# Patient Record
Sex: Male | Born: 1985 | Hispanic: Yes | Marital: Married | Smoking: Former smoker
Health system: Southern US, Community
[De-identification: ages and names within clinical notes are randomized; demographics above are authoritative.]

---

## 2020-07-11 ENCOUNTER — Emergency Department (HOSPITAL_COMMUNITY): Payer: HRSA Program

## 2020-07-11 ENCOUNTER — Other Ambulatory Visit: Payer: Self-pay

## 2020-07-11 ENCOUNTER — Emergency Department (HOSPITAL_COMMUNITY)
Admission: EM | Admit: 2020-07-11 | Discharge: 2020-07-12 | Disposition: A | Discharge status: Home / Self Care | Payer: HRSA Program | Attending: Emergency Medicine | Admitting: Emergency Medicine

## 2020-07-11 DIAGNOSIS — U071 COVID-19: Secondary | ICD-10-CM | POA: Insufficient documentation

## 2020-07-11 DIAGNOSIS — Z5321 Procedure and treatment not carried out due to patient leaving prior to being seen by health care provider: Secondary | ICD-10-CM | POA: Insufficient documentation

## 2020-07-11 DIAGNOSIS — R059 Cough, unspecified: Secondary | ICD-10-CM | POA: Diagnosis present

## 2020-07-11 LAB — SARS CORONAVIRUS 2 (TAT 6-24 HRS): SARS Coronavirus 2: POSITIVE — AB

## 2020-07-11 MED ORDER — ACETAMINOPHEN 325 MG PO TABS
650.0000 mg | ORAL_TABLET | Freq: Once | ORAL | Status: AC | PRN
  Administered 2020-07-11: 650 mg via ORAL
  Filled 2020-07-11: qty 2

## 2020-07-11 NOTE — ED Triage Notes (Signed)
Pt reports cough, sob, and fever since Thursday. resp e.u, pt has not had any covid vaccines. Pt febrile in triage.

## 2020-07-12 NOTE — ED Notes (Signed)
Called Pt several times for vitals no answer.

## 2020-07-15 ENCOUNTER — Telehealth (HOSPITAL_COMMUNITY): Payer: Self-pay

## 2020-07-28 ENCOUNTER — Inpatient Hospital Stay (HOSPITAL_COMMUNITY)
Admission: EM | Admit: 2020-07-28 | Discharge: 2020-07-30 | DRG: 871 | Disposition: A | Discharge status: Home / Self Care | Payer: Self-pay | Attending: Family Medicine | Admitting: Family Medicine

## 2020-07-28 ENCOUNTER — Other Ambulatory Visit: Payer: Self-pay

## 2020-07-28 ENCOUNTER — Encounter (HOSPITAL_COMMUNITY): Payer: Self-pay | Admitting: *Deleted

## 2020-07-28 ENCOUNTER — Emergency Department (HOSPITAL_COMMUNITY): Payer: Self-pay

## 2020-07-28 DIAGNOSIS — E872 Acidosis: Secondary | ICD-10-CM | POA: Diagnosis present

## 2020-07-28 DIAGNOSIS — E861 Hypovolemia: Secondary | ICD-10-CM | POA: Diagnosis present

## 2020-07-28 DIAGNOSIS — E876 Hypokalemia: Secondary | ICD-10-CM | POA: Diagnosis present

## 2020-07-28 DIAGNOSIS — J9601 Acute respiratory failure with hypoxia: Secondary | ICD-10-CM | POA: Diagnosis present

## 2020-07-28 DIAGNOSIS — Z8249 Family history of ischemic heart disease and other diseases of the circulatory system: Secondary | ICD-10-CM

## 2020-07-28 DIAGNOSIS — D6959 Other secondary thrombocytopenia: Secondary | ICD-10-CM | POA: Diagnosis present

## 2020-07-28 DIAGNOSIS — E871 Hypo-osmolality and hyponatremia: Secondary | ICD-10-CM | POA: Diagnosis present

## 2020-07-28 DIAGNOSIS — A4189 Other specified sepsis: Principal | ICD-10-CM | POA: Diagnosis present

## 2020-07-28 DIAGNOSIS — J1282 Pneumonia due to coronavirus disease 2019: Secondary | ICD-10-CM | POA: Diagnosis present

## 2020-07-28 DIAGNOSIS — R030 Elevated blood-pressure reading, without diagnosis of hypertension: Secondary | ICD-10-CM | POA: Diagnosis present

## 2020-07-28 DIAGNOSIS — U071 COVID-19: Secondary | ICD-10-CM | POA: Diagnosis present

## 2020-07-28 LAB — COMPREHENSIVE METABOLIC PANEL
ALT: 159 U/L — ABNORMAL HIGH (ref 0–44)
AST: 72 U/L — ABNORMAL HIGH (ref 15–41)
Albumin: 3.2 g/dL — ABNORMAL LOW (ref 3.5–5.0)
Alkaline Phosphatase: 53 U/L (ref 38–126)
Anion gap: 16 — ABNORMAL HIGH (ref 5–15)
BUN: 13 mg/dL (ref 6–20)
CO2: 21 mmol/L — ABNORMAL LOW (ref 22–32)
Calcium: 8.6 mg/dL — ABNORMAL LOW (ref 8.9–10.3)
Chloride: 94 mmol/L — ABNORMAL LOW (ref 98–111)
Creatinine, Ser: 1.01 mg/dL (ref 0.61–1.24)
GFR, Estimated: >60 mL/min (ref >60)
Glucose, Bld: 100 mg/dL — ABNORMAL HIGH (ref 70–99)
Potassium: 3.2 mmol/L — ABNORMAL LOW (ref 3.5–5.1)
Sodium: 131 mmol/L — ABNORMAL LOW (ref 135–145)
Total Bilirubin: 0.9 mg/dL (ref 0.3–1.2)
Total Protein: 8.2 g/dL — ABNORMAL HIGH (ref 6.5–8.1)

## 2020-07-28 LAB — CBC WITH DIFFERENTIAL/PLATELET
Abs Immature Granulocytes: 0.22 10*3/uL — ABNORMAL HIGH (ref 0.00–0.07)
Basophils Absolute: 0.1 10*3/uL (ref 0.0–0.1)
Basophils Relative: 1 %
Eosinophils Absolute: 1 10*3/uL — ABNORMAL HIGH (ref 0.0–0.5)
Eosinophils Relative: 8 %
HCT: 44.8 % (ref 39.0–52.0)
Hemoglobin: 15.5 g/dL (ref 13.0–17.0)
Immature Granulocytes: 2 %
Lymphocytes Relative: 13 %
Lymphs Abs: 1.6 10*3/uL (ref 0.7–4.0)
MCH: 30.3 pg (ref 26.0–34.0)
MCHC: 34.6 g/dL (ref 30.0–36.0)
MCV: 87.7 fL (ref 80.0–100.0)
Monocytes Absolute: 0.5 10*3/uL (ref 0.1–1.0)
Monocytes Relative: 4 %
Neutro Abs: 8.8 10*3/uL — ABNORMAL HIGH (ref 1.7–7.7)
Neutrophils Relative %: 72 %
Platelets: 142 10*3/uL — ABNORMAL LOW (ref 150–400)
RBC: 5.11 MIL/uL (ref 4.22–5.81)
RDW: 13.5 % (ref 11.5–15.5)
WBC: 12.2 10*3/uL — ABNORMAL HIGH (ref 4.0–10.5)
nRBC: 0 % (ref 0.0–0.2)

## 2020-07-28 LAB — LACTIC ACID, PLASMA
Lactic Acid, Venous: 2.8 mmol/L (ref 0.5–1.9)
Lactic Acid, Venous: 1.9 mmol/L (ref 0.5–1.9)
Lactic Acid, Venous: 1.7 mmol/L (ref 0.5–1.9)

## 2020-07-28 LAB — BRAIN NATRIURETIC PEPTIDE: B Natriuretic Peptide: 17.5 pg/mL (ref 0.0–100.0)

## 2020-07-28 LAB — FIBRINOGEN: Fibrinogen: 677 mg/dL — ABNORMAL HIGH (ref 210–475)

## 2020-07-28 LAB — D-DIMER, QUANTITATIVE: D-Dimer, Quant: 5.52 ug/mL-FEU — ABNORMAL HIGH (ref 0.00–0.50)

## 2020-07-28 LAB — LACTATE DEHYDROGENASE: LDH: 485 U/L — ABNORMAL HIGH (ref 98–192)

## 2020-07-28 LAB — TROPONIN I (HIGH SENSITIVITY)
Troponin I (High Sensitivity): 3 ng/L (ref <18)
Troponin I (High Sensitivity): 4 ng/L (ref <18)

## 2020-07-28 LAB — HIV ANTIBODY (ROUTINE TESTING W REFLEX): HIV Screen 4th Generation wRfx: NONREACTIVE

## 2020-07-28 LAB — TRIGLYCERIDES: Triglycerides: 213 mg/dL — ABNORMAL HIGH (ref <150)

## 2020-07-28 LAB — C-REACTIVE PROTEIN: CRP: 10.6 mg/dL — ABNORMAL HIGH (ref <1.0)

## 2020-07-28 LAB — MAGNESIUM: Magnesium: 1.9 mg/dL (ref 1.7–2.4)

## 2020-07-28 LAB — PROCALCITONIN: Procalcitonin: 1.53 ng/mL

## 2020-07-28 LAB — FERRITIN: Ferritin: 2893 ng/mL — ABNORMAL HIGH (ref 24–336)

## 2020-07-28 MED ORDER — ACETAMINOPHEN 500 MG PO TABS
1000.0000 mg | ORAL_TABLET | Freq: Once | ORAL | Status: AC
  Administered 2020-07-28: 1000 mg via ORAL
  Filled 2020-07-28: qty 2

## 2020-07-28 MED ORDER — LACTATED RINGERS IV SOLN: INTRAVENOUS | Status: DC

## 2020-07-28 MED ORDER — IPRATROPIUM-ALBUTEROL 20-100 MCG/ACT IN AERS
1.0000 | INHALATION_SPRAY | Freq: Four times a day (QID) | RESPIRATORY_TRACT | Status: DC
  Administered 2020-07-28 – 2020-07-30 (×6): 1 via RESPIRATORY_TRACT
  Filled 2020-07-28 (×3): qty 4

## 2020-07-28 MED ORDER — METHYLPREDNISOLONE SODIUM SUCC 40 MG IJ SOLR: 40.0000 mg | Freq: Two times a day (BID) | INTRAMUSCULAR | Status: DC

## 2020-07-28 MED ORDER — POLYETHYLENE GLYCOL 3350 17 G PO PACK
17.0000 g | PACK | Freq: Every day | ORAL | Status: DC
  Administered 2020-07-30: 17 g via ORAL
  Filled 2020-07-28 (×3): qty 1

## 2020-07-28 MED ORDER — GUAIFENESIN-DM 100-10 MG/5ML PO SYRP: 10.0000 mL | ORAL_SOLUTION | ORAL | Status: DC | PRN

## 2020-07-28 MED ORDER — METHYLPREDNISOLONE SODIUM SUCC 40 MG IJ SOLR
40.0000 mg | Freq: Four times a day (QID) | INTRAMUSCULAR | Status: DC
  Administered 2020-07-28 – 2020-07-30 (×8): 40 mg via INTRAVENOUS
  Filled 2020-07-28 (×8): qty 1

## 2020-07-28 MED ORDER — SODIUM CHLORIDE 0.9 % IV SOLN
2.0000 g | INTRAVENOUS | Status: DC
  Administered 2020-07-29: 2 g via INTRAVENOUS
  Filled 2020-07-28 (×3): qty 20

## 2020-07-28 MED ORDER — PANTOPRAZOLE SODIUM 40 MG PO TBEC
40.0000 mg | DELAYED_RELEASE_TABLET | Freq: Every day | ORAL | Status: DC
  Administered 2020-07-29 – 2020-07-30 (×2): 40 mg via ORAL
  Filled 2020-07-28 (×2): qty 1

## 2020-07-28 MED ORDER — LACTATED RINGERS IV BOLUS
1000.0000 mL | Freq: Once | INTRAVENOUS | Status: AC
  Administered 2020-07-28: 1000 mL via INTRAVENOUS

## 2020-07-28 MED ORDER — SODIUM CHLORIDE 0.9 % IV SOLN
1.0000 g | Freq: Once | INTRAVENOUS | Status: AC
  Administered 2020-07-28: 1 g via INTRAVENOUS
  Filled 2020-07-28: qty 10

## 2020-07-28 MED ORDER — SODIUM CHLORIDE 0.9 % IV BOLUS
1000.0000 mL | Freq: Once | INTRAVENOUS | Status: AC
  Administered 2020-07-28: 1000 mL via INTRAVENOUS

## 2020-07-28 MED ORDER — SODIUM CHLORIDE 0.9 % IV SOLN
500.0000 mg | Freq: Once | INTRAVENOUS | Status: AC
  Administered 2020-07-28: 500 mg via INTRAVENOUS
  Filled 2020-07-28: qty 500

## 2020-07-28 MED ORDER — IOHEXOL 350 MG/ML SOLN
75.0000 mL | Freq: Once | INTRAVENOUS | Status: AC | PRN
  Administered 2020-07-28: 75 mL via INTRAVENOUS

## 2020-07-28 MED ORDER — POTASSIUM CHLORIDE CRYS ER 20 MEQ PO TBCR
40.0000 meq | EXTENDED_RELEASE_TABLET | Freq: Once | ORAL | Status: AC
  Administered 2020-07-28: 40 meq via ORAL
  Filled 2020-07-28: qty 2

## 2020-07-28 MED ORDER — ENOXAPARIN SODIUM 100 MG/ML ~~LOC~~ SOLN
1.0000 mg/kg | Freq: Two times a day (BID) | SUBCUTANEOUS | Status: DC
  Filled 2020-07-28 (×3): qty 0.85

## 2020-07-28 MED ORDER — AZITHROMYCIN 250 MG PO TABS
500.0000 mg | ORAL_TABLET | ORAL | Status: DC
  Administered 2020-07-29 – 2020-07-30 (×2): 500 mg via ORAL
  Filled 2020-07-28 (×2): qty 2

## 2020-07-28 MED ORDER — HYDROCOD POLST-CPM POLST ER 10-8 MG/5ML PO SUER: 5.0000 mL | Freq: Two times a day (BID) | ORAL | Status: DC | PRN

## 2020-07-28 MED ORDER — TRAZODONE HCL 50 MG PO TABS
50.0000 mg | ORAL_TABLET | Freq: Every evening | ORAL | Status: DC | PRN
Start: 2020-07-28 — End: 2020-07-30

## 2020-07-28 MED ORDER — ALBUTEROL SULFATE HFA 108 (90 BASE) MCG/ACT IN AERS
2.0000 | INHALATION_SPRAY | RESPIRATORY_TRACT | Status: DC | PRN
  Administered 2020-07-29: 2 via RESPIRATORY_TRACT
  Filled 2020-07-28: qty 6.7

## 2020-07-28 MED ORDER — ZINC SULFATE 220 (50 ZN) MG PO CAPS
220.0000 mg | ORAL_CAPSULE | Freq: Every day | ORAL | Status: DC
  Administered 2020-07-28 – 2020-07-30 (×3): 220 mg via ORAL
  Filled 2020-07-28 (×3): qty 1

## 2020-07-28 MED ORDER — ASCORBIC ACID 500 MG PO TABS
500.0000 mg | ORAL_TABLET | Freq: Every day | ORAL | Status: DC
  Administered 2020-07-28 – 2020-07-30 (×3): 500 mg via ORAL
  Filled 2020-07-28 (×3): qty 1

## 2020-07-28 NOTE — ED Notes (Signed)
Date and time results received: 07/28/20 5:29 PM    Test: LA Critical Value: 2.8  Name of Provider Notified: Steinl  Orders Received? Or Actions Taken?:

## 2020-07-28 NOTE — ED Triage Notes (Signed)
Pt has had covid symptoms since Monday, tested (+) Wed. Shob, cough and tachypnea today.Sats 86 in triage HR 140

## 2020-07-28 NOTE — ED Provider Notes (Signed)
[facility]-EMERGENCY DEPT Provider Note   CSN: [id number] Arrival date & time: 07/28/20  1232     History Chief Complaint  Patient presents with  . Covid Positive  . Cough    James Vaughan is a 35 y.o. male.  Patient presents with cough, sob, body aches, poor appetite, in the past two weeks. Symptoms acute onset, moderate, constant, persistent, slowly/steadily worse without acute or abrupt worsening today. Denies chest pain. No leg pain or swelling. No hx dvt or pe. Prior covid test 07/11/2020 positive. Pt is not vaccinated. Denies sore throat or trouble swallowing. No headache. No neck pain or stiffness. No abd pain or nvd. No dysuria. No rash.   The history is provided by the patient.  Cough Associated symptoms: fever, myalgias and shortness of breath   Associated symptoms: no chest pain, no headaches, no rash and no sore throat        History reviewed. No pertinent past medical history.  There are no problems to display for this patient.   History reviewed. No pertinent surgical history.     No family history on file.  Social History   Tobacco Use  . Smoking status: Unknown If Ever Smoked    Home Medications Prior to Admission medications   Not on File    Allergies    Patient has no known allergies.  Review of Systems   Review of Systems  Constitutional: Positive for appetite change and fever.  HENT: Negative for sore throat.   Eyes: Negative for redness.  Respiratory: Positive for cough and shortness of breath.   Cardiovascular: Negative for chest pain.  Gastrointestinal: Negative for abdominal pain, diarrhea and vomiting.  Endocrine: Negative for polyuria.  Genitourinary: Negative for dysuria and flank pain.  Musculoskeletal: Positive for myalgias. Negative for neck pain and neck stiffness.  Skin: Negative for rash.  Neurological: Negative for headaches.  Hematological: Does not bruise/bleed easily.  Psychiatric/Behavioral:  Negative for confusion.    Physical Exam Updated Vital Signs BP 121/76 (BP Location: Left Arm)   Pulse (!) 135   Temp 99 F (37.2 C) (Oral)   Resp (!) 22   Ht 1.702 m (5\' 7" )   Wt 86.2 kg   SpO2 (!) 88%   BMI 29.76 kg/m   Physical Exam Vitals and nursing note reviewed.  Constitutional:      Appearance: Normal appearance. He is well-developed.  HENT:     Head: Atraumatic.     Nose: Nose normal.     Mouth/Throat:     Mouth: Mucous membranes are moist.     Pharynx: Oropharynx is clear.  Eyes:     General: No scleral icterus.    Conjunctiva/sclera: Conjunctivae normal.     Pupils: Pupils are equal, round, and reactive to light.  Neck:     Trachea: No tracheal deviation.     Comments: No stiffness or rigidity Cardiovascular:     Rate and Rhythm: Normal rate and regular rhythm.     Pulses: Normal pulses.     Heart sounds: Normal heart sounds. No murmur heard. No friction rub. No gallop.   Pulmonary:     Effort: No accessory muscle usage.     Comments: Upper resp congestion, occasional non prod cough. No wheezing. Overall breathing comfortably, pt appears sob w walking.  Abdominal:     General: Bowel sounds are normal. There is no distension.     Palpations: Abdomen is soft.     Tenderness: There is no abdominal  tenderness.  Genitourinary:    Comments: No cva tenderness. Musculoskeletal:        General: No swelling or tenderness.     Cervical back: Normal range of motion and neck supple. No rigidity.     Right lower leg: No edema.     Left lower leg: No edema.  Skin:    General: Skin is warm and dry.     Findings: No rash.  Neurological:     Mental Status: He is alert.     Comments: Alert, speech clear.   Psychiatric:        Mood and Affect: Mood normal.     ED Results / Procedures / Treatments   Labs (all labs ordered are listed, but only abnormal results are displayed) Results for orders placed or performed during the hospital encounter of 07/28/20  CBC  WITH DIFFERENTIAL  Result Value Ref Range   WBC 12.2 (H) 4.0 - 10.5 K/uL   RBC 5.11 4.22 - 5.81 MIL/uL   Hemoglobin 15.5 13.0 - 17.0 g/dL   HCT 77.8 24.2 - 35.3 %   MCV 87.7 80.0 - 100.0 fL   MCH 30.3 26.0 - 34.0 pg   MCHC 34.6 30.0 - 36.0 g/dL   RDW 61.4 43.1 - 54.0 %   Platelets 142 (L) 150 - 400 K/uL   nRBC 0.0 0.0 - 0.2 %   Neutrophils Relative % 72 %   Neutro Abs 8.8 (H) 1.7 - 7.7 K/uL   Lymphocytes Relative 13 %   Lymphs Abs 1.6 0.7 - 4.0 K/uL   Monocytes Relative 4 %   Monocytes Absolute 0.5 0.1 - 1.0 K/uL   Eosinophils Relative 8 %   Eosinophils Absolute 1.0 (H) 0.0 - 0.5 K/uL   Basophils Relative 1 %   Basophils Absolute 0.1 0.0 - 0.1 K/uL   Immature Granulocytes 2 %   Abs Immature Granulocytes 0.22 (H) 0.00 - 0.07 K/uL   Reactive, Benign Lymphocytes PRESENT   Comprehensive metabolic panel  Result Value Ref Range   Sodium 131 (L) 135 - 145 mmol/L   Potassium 3.2 (L) 3.5 - 5.1 mmol/L   Chloride 94 (L) 98 - 111 mmol/L   CO2 21 (L) 22 - 32 mmol/L   Glucose, Bld 100 (H) 70 - 99 mg/dL   BUN 13 6 - 20 mg/dL   Creatinine, Ser 0.86 0.61 - 1.24 mg/dL   Calcium 8.6 (L) 8.9 - 10.3 mg/dL   Total Protein 8.2 (H) 6.5 - 8.1 g/dL   Albumin 3.2 (L) 3.5 - 5.0 g/dL   AST 72 (H) 15 - 41 U/L   ALT 159 (H) 0 - 44 U/L   Alkaline Phosphatase 53 38 - 126 U/L   Total Bilirubin 0.9 0.3 - 1.2 mg/dL   GFR, Estimated >76 >19 mL/min   Anion gap 16 (H) 5 - 15  D-dimer, quantitative  Result Value Ref Range   D-Dimer, Quant 5.52 (H) 0.00 - 0.50 ug/mL-FEU  Procalcitonin  Result Value Ref Range   Procalcitonin 1.53 ng/mL  Lactate dehydrogenase  Result Value Ref Range   LDH 485 (H) 98 - 192 U/L  Triglycerides  Result Value Ref Range   Triglycerides 213 (H) <150 mg/dL  Fibrinogen  Result Value Ref Range   Fibrinogen 677 (H) 210 - 475 mg/dL   DG Chest 2 View  Result Date: 07/28/2020 CLINICAL DATA:  Shortness of breath, COVID symptoms EXAM: CHEST - 2 VIEW COMPARISON:  July 11, 2020  FINDINGS: The heart size  and mediastinal contours are within normal limits. Extensive multifocal patchy airspace opacities are seen throughout both lungs, most notable within the periphery of the left lung. No pleural effusion is seen. IMPRESSION: Interval progression of extensive multifocal airspace opacities, left greater than right, consistent with multifocal pneumonia Electronically Signed   By: Jonna Clark M.D.   On: 07/28/2020 13:25   DG Chest Portable 1 View  Result Date: 07/11/2020 CLINICAL DATA:  Shortness of breath and cough EXAM: PORTABLE CHEST 1 VIEW COMPARISON:  None. FINDINGS: Low lung volumes are present, causing crowding of the pulmonary vasculature. Bibasilar airspace opacities are present, left greater than right. Airway thickening is also noted. Heart size within normal limits. No blunting of the costophrenic angles. IMPRESSION: 1. Bibasilar airspace opacities, left greater than right, suspicious for pneumonia. 2. Airway thickening is also noted, suggesting bronchitis or reactive airways disease. Electronically Signed   By: Gaylyn Rong M.D.   On: 07/11/2020 17:26    EKG EKG Interpretation  Date/Time:  Thursday July 28 2020 15:48:58 EST Ventricular Rate:  139 PR Interval:    QRS Duration: 99 QT Interval:  322 QTC Calculation: 490 R Axis:   65 Text Interpretation: Narrow QRS tachycardia `a flutter w 2:1 block vs sinus tachycardia Prolonged QT interval No previous tracing Confirmed by Cathren Laine ([id number]) on 07/28/2020 4:22:39 PM   Radiology DG Chest 2 View  Result Date: 07/28/2020 CLINICAL DATA:  Shortness of breath, COVID symptoms EXAM: CHEST - 2 VIEW COMPARISON:  July 11, 2020 FINDINGS: The heart size and mediastinal contours are within normal limits. Extensive multifocal patchy airspace opacities are seen throughout both lungs, most notable within the periphery of the left lung. No pleural effusion is seen. IMPRESSION: Interval progression of extensive  multifocal airspace opacities, left greater than right, consistent with multifocal pneumonia Electronically Signed   By: Jonna Clark M.D.   On: 07/28/2020 13:25    Procedures Procedures   Medications Ordered in ED Medications  albuterol (VENTOLIN HFA) 108 (90 Base) MCG/ACT inhaler 2 puff (has no administration in time range)    ED Course  I have reviewed the triage vital signs and the nursing notes.  Pertinent labs & imaging results that were available during my care of the patient were reviewed by me and considered in my medical decision making (see chart for details).    MDM Rules/Calculators/A&P                         Iv ns. Continuous pulse ox and cardiac monitoring. Stat labs and imaging.   COVID test 07/11/2020 is positive.   James Vaughan was evaluated in Emergency Department on 07/28/2020 for the symptoms described in the history of present illness. He was evaluated in the context of the global COVID-19 pandemic, which necessitated consideration that the patient might be at risk for infection with the SARS-CoV-2 virus that causes COVID-19. Institutional protocols and algorithms that pertain to the evaluation of patients at risk for COVID-19 are in a state of rapid change based on information released by regulatory bodies including the CDC and federal and state organizations. These policies and algorithms were followed during the patient's care in the ED.  Reviewed nursing notes and prior charts for additional history.   Today's labs reviewed/interpreted by me - k mildly low. kcl po. Inflammatory markers elevated c/w covid.   CXR reviewed/interpreted by me - c/w covid, covid pna.   Pt denies any chest pain or discomfort.  Is  hypoxic on room air, sats is mid 90s on 3 liters [place].   Currently on monitor, appears sinus tachy, rate 118.    Hospitalists consulted for admission re covid pna/arf due to covid.   CRITICAL CARE RE: covid infection with acute respiratory failure w  hypoxia Performed by: Suzi Roots Total critical care time: 45 minutes Critical care time was exclusive of separately billable procedures and treating other patients. Critical care was necessary to treat or prevent imminent or life-threatening deterioration. Critical care was time spent personally by me on the following activities: development of treatment plan with patient and/or surrogate as well as nursing, discussions with consultants, evaluation of patient's response to treatment, examination of patient, obtaining history from patient or surrogate, ordering and performing treatments and interventions, ordering and review of laboratory studies, ordering and review of radiographic studies, pulse oximetry and re-evaluation of patient's condition.    Final Clinical Impression(s) / ED Diagnoses Final diagnoses:  None    Rx / DC Orders ED Discharge Orders    None       Cathren Laine, MD 07/28/20 2314

## 2020-07-28 NOTE — H&P (Addendum)
History and Physical    James Vaughan BZJ:[id number] DOB: January 06, 1986 DOA: 07/28/2020  PCP: Patient, No Pcp Per  Patient coming from: home  I have personally briefly reviewed patient's old medical records in [facility]  Chief Complaint: shortness of breath  HPI: James Vaughan is a 35 y.o. male without notable significant medical history, not vaccinated for sars cov2 infection presents with worsening shortness of breath over the past several days in setting of known covid 69 infection diagnosed on 07/11/2020.  Symptoms started prior to 1/10 with cough, SOB, and fever per triage note (left from ED without being seen), though he notes today he isn't really able to remember his symptoms prior to Monday.  Monday he notices worsening SOB.  He's was unable to walk up stairs or to bathroom without developing significant SOB requiring him to stop to catch his breath.  Denies recent fever.  Denies change in taste or smell.  Notes chest discomfort.  Denies abdominal pain, but has decreased appetite.  Denies LE swelling.  No smoking or drinking.  Denies any medical issues or taking any medicines.  ED Course: Labs, imaging, abx, hospitalist to admit for covid infection   Review of Systems: As per HPI otherwise all other systems reviewed and are negative.   History reviewed. No pertinent past medical history.  History reviewed. No pertinent surgical history.  Social History  has no history on file for tobacco use, alcohol use, and drug use. denies smoking, etoh  No Known Allergies  No family history on file. Mom with hx HTN  Prior to Admission medications   Not on File    Physical Exam: Vitals:   07/28/20 1645 07/28/20 1700 07/28/20 1703 07/28/20 1715  BP:    (!) 155/130  Pulse: (!) 121 (!) 123  (!) 116  Resp: (!) 36 (!) 40  (!) 35  Temp:      TempSrc:      SpO2: 91% 95% 93% 95%  Weight:      Height:        Constitutional: NAD, calm, comfortable Vitals:   07/28/20 1645  07/28/20 1700 07/28/20 1703 07/28/20 1715  BP:    (!) 155/130  Pulse: (!) 121 (!) 123  (!) 116  Resp: (!) 36 (!) 40  (!) 35  Temp:      TempSrc:      SpO2: 91% 95% 93% 95%  Weight:      Height:       Eyes: PERRL, lids and conjunctivae normal ENMT: Mucous membranes are moist. Posterior pharynx clear of any exudate or lesions.Normal dentition.  Neck: normal, supple Respiratory: increased wob, scattered wheezes and crackles, tachypneic after sitting up in bed for me to auscultate Cardiovascular: Regular rate and rhythm, no murmurs / rubs / gallops. No extremity edema. Abdomen: no tenderness, no masses palpated. No hepatosplenomegaly. Bowel sounds positive.  Musculoskeletal: no clubbing / cyanosis. No joint deformity upper and lower extremities. Good ROM, no contractures. Normal muscle tone.  Skin: no rashes, lesions, ulcers. No induration Neurologic: CN 2-12 grossly intact. Sensation intact. Strength 5/5 in all 4.  Psychiatric: Normal judgment and insight. Alert and oriented x 3. Normal mood.   Labs on Admission: I have personally reviewed following labs and imaging studies  CBC: Recent Labs  Lab 07/28/20 1448  WBC 12.2*  NEUTROABS 8.8*  HGB 15.5  HCT 44.8  MCV 87.7  PLT 142*    Basic Metabolic Panel: Recent Labs  Lab 07/28/20 1448  NA 131*  K 3.2*  CL 94*  CO2 21*  GLUCOSE 100*  BUN 13  CREATININE 1.01  CALCIUM 8.6*    GFR: Estimated Creatinine Clearance: 110 mL/min (by C-G formula based on SCr of 1.01 mg/dL).  Liver Function Tests: Recent Labs  Lab 07/28/20 1448  AST 72*  ALT 159*  ALKPHOS 53  BILITOT 0.9  PROT 8.2*  ALBUMIN 3.2*    Urine analysis: No results found for: COLORURINE, APPEARANCEUR, LABSPEC, PHURINE, GLUCOSEU, HGBUR, BILIRUBINUR, KETONESUR, PROTEINUR, UROBILINOGEN, NITRITE, LEUKOCYTESUR  Radiological Exams on Admission: DG Chest 2 View  Result Date: 07/28/2020 CLINICAL DATA:  Shortness of breath, COVID symptoms EXAM: CHEST - 2 VIEW  COMPARISON:  July 11, 2020 FINDINGS: The heart size and mediastinal contours are within normal limits. Extensive multifocal patchy airspace opacities are seen throughout both lungs, most notable within the periphery of the left lung. No pleural effusion is seen. IMPRESSION: Interval progression of extensive multifocal airspace opacities, left greater than right, consistent with multifocal pneumonia Electronically Signed   By: Jonna Clark M.D.   On: 07/28/2020 13:25    EKG: Independently reviewed. Sinus tach (vs?flutter - suspect sinus tach, will follow on tele and serial EKG), S1Q3T3 pattern, no prior EKG's  Assessment/Plan Active Problems:   COVID-19  Acute Hypoxic Respiratory Failure 2/2 COVID 19 Pneumonia  Concern for Superimposed Bacterial Pneumonia  Sepsis 2/2 covid Tested positive 07/11/2020 Satting in 90's on 3 L  sepsis ruled in with tachycardia, tachypnea/hypoxia, leukocytosis and in setting of covid CXR with interval progression of extensive multifocal airspace opacities L>R c/w multifocal pneumonia Procalcitonin is elevated -> ceftriaxone/azithro.  Follow sputum cx, MRSA pcr, urine strep, urine legionella. Follow RVP >10 days from presentation, low likelihood of benefit from remdesivir Steroids with taper.  With elevated procal and modest O2 requirement, hold off on actemra/baricitinib for now. Therapeutic lovenox with elevated d dimer, SOB seems out of proportion to hypoxia, S1Q3T3 on EKG Strict I/O, daily weights Prone as able, IS, flutter, OOB, therapy  COVID-19 Labs  Recent Labs    07/28/20 1448  DDIMER 5.52*  FERRITIN 2,893*  LDH 485*  CRP 10.6*    Lab Results  Component Value Date   SARSCOV2NAA POSITIVE (A) 07/11/2020   Elevated D dimer  S1Q3T3 Pattern on EKG  Concern for VTE: dyspnea seems out of proportion to oxygen requirements.  S1Q3T3 pattern on EKG suggests RH strain which could be due to pneumonia vs PE. Therapeutic lovenox LE [place], echo, CT PE  protocol Troponin, BNP  Sinus Tachycardia: 2/2 above (? Possible flutter, though suspect sinus tach) follow on tele and serial EKG's  Lactic Acidosis  Anion Gap Metabolic Acidosis: in setting of sepsis and covid pneumonia - will bolus conservatively and add gentle MIVF in setting of respiratory failure.  Trend  Hyponatremia  Hypokalemia: replace and follow, follow mag - hyponatremia likely hypovolemia with poor intake  Chest Discomfort: suspect pleuritic in setting of covid.  Will follow cardiac enzymes.  Elevated Blood Pressures: no formal diagnosis, monitor, possibly elevated with discomfort/SOB.  May need antihypertensives added if persistently elevated  Elevated LFT's: likely 2/2 covid, follow - acute hepatitis panel.  Normal bili.  Consider additional w/u as indicated.  Thrombocytopenia: mild, follow, 2/2 covid  DVT prophylaxis: Therapeutic lovenox  Code Status:   full  Family Communication:  None at bedside  Disposition Plan:   Patient is from:  home  Anticipated DC to:  home  Anticipated DC date:  >3 days  Anticipated DC barriers: Improvement in resp  distress  Consults called:  none Admission status:  inpatient   Severity of Illness: The appropriate patient status for this patient is INPATIENT. Inpatient status is judged to be reasonable and necessary in order to provide the required intensity of service to ensure the patient's safety. The patient's presenting symptoms, physical exam findings, and initial radiographic and laboratory data in the context of their chronic comorbidities is felt to place them at high risk for further clinical deterioration. Furthermore, it is not anticipated that the patient will be medically stable for discharge from the hospital within 2 midnights of admission. The following factors support the patient status of inpatient.   " The patient's presenting symptoms include shortness of breath, chest pain. " The worrisome physical exam findings  include hypoxia, increased WOB. " The initial radiographic and laboratory data are worrisome because of elevated d dimer, multifocal pneumonia.   * I certify that at the point of admission it is my clinical judgment that the patient will require inpatient hospital care spanning beyond 2 midnights from the point of admission due to high intensity of service, high risk for further deterioration and high frequency of surveillance required.Lacretia Nicks MD [facility]  How to contact the [facility] Attending or Consulting provider 7A - 7P or covering provider during after hours 7P -7A, for this patient?   1. Check the care team in [facility] and look for a) attending/consulting [facility] provider listed and b) the [facility] team listed 2. Log into [email or web address] and use [facility] universal password to access. If you do not have the password, please contact the hospital operator. 3. Locate the [facility] provider you are looking for under [facility] and page to a number that you can be directly reached. 4. If you still have difficulty reaching the provider, please page the [facility] (Director on Call) for the Hospitalists listed on amion for assistance.  07/28/2020, 5:37 PM

## 2020-07-29 ENCOUNTER — Encounter (HOSPITAL_COMMUNITY): Payer: Self-pay | Admitting: Family Medicine

## 2020-07-29 ENCOUNTER — Inpatient Hospital Stay (HOSPITAL_COMMUNITY): Payer: Self-pay

## 2020-07-29 DIAGNOSIS — A4189 Other specified sepsis: Principal | ICD-10-CM

## 2020-07-29 DIAGNOSIS — U071 COVID-19: Secondary | ICD-10-CM

## 2020-07-29 DIAGNOSIS — J9601 Acute respiratory failure with hypoxia: Secondary | ICD-10-CM

## 2020-07-29 DIAGNOSIS — R000 Tachycardia, unspecified: Secondary | ICD-10-CM | POA: Insufficient documentation

## 2020-07-29 DIAGNOSIS — D696 Thrombocytopenia, unspecified: Secondary | ICD-10-CM

## 2020-07-29 DIAGNOSIS — J1282 Pneumonia due to coronavirus disease 2019: Secondary | ICD-10-CM

## 2020-07-29 DIAGNOSIS — R7989 Other specified abnormal findings of blood chemistry: Secondary | ICD-10-CM

## 2020-07-29 LAB — CBC WITH DIFFERENTIAL/PLATELET
Abs Immature Granulocytes: 0.1 10*3/uL — ABNORMAL HIGH (ref 0.00–0.07)
Basophils Absolute: 0.1 10*3/uL (ref 0.0–0.1)
Basophils Relative: 1 %
Eosinophils Absolute: 0.2 10*3/uL (ref 0.0–0.5)
Eosinophils Relative: 2 %
HCT: 37.2 % — ABNORMAL LOW (ref 39.0–52.0)
Hemoglobin: 12.7 g/dL — ABNORMAL LOW (ref 13.0–17.0)
Immature Granulocytes: 2 %
Lymphocytes Relative: 21 %
Lymphs Abs: 1.4 10*3/uL (ref 0.7–4.0)
MCH: 30.1 pg (ref 26.0–34.0)
MCHC: 34.1 g/dL (ref 30.0–36.0)
MCV: 88.2 fL (ref 80.0–100.0)
Monocytes Absolute: 0.2 10*3/uL (ref 0.1–1.0)
Monocytes Relative: 2 %
Neutro Abs: 4.8 10*3/uL (ref 1.7–7.7)
Neutrophils Relative %: 72 %
Platelets: 120 10*3/uL — ABNORMAL LOW (ref 150–400)
RBC: 4.22 MIL/uL (ref 4.22–5.81)
RDW: 13.7 % (ref 11.5–15.5)
WBC: 6.6 10*3/uL (ref 4.0–10.5)
nRBC: 0 % (ref 0.0–0.2)

## 2020-07-29 LAB — COMPREHENSIVE METABOLIC PANEL
ALT: 127 U/L — ABNORMAL HIGH (ref 0–44)
AST: 55 U/L — ABNORMAL HIGH (ref 15–41)
Albumin: 2.6 g/dL — ABNORMAL LOW (ref 3.5–5.0)
Alkaline Phosphatase: 45 U/L (ref 38–126)
Anion gap: 10 (ref 5–15)
BUN: 16 mg/dL (ref 6–20)
CO2: 23 mmol/L (ref 22–32)
Calcium: 8.4 mg/dL — ABNORMAL LOW (ref 8.9–10.3)
Chloride: 103 mmol/L (ref 98–111)
Creatinine, Ser: 0.84 mg/dL (ref 0.61–1.24)
GFR, Estimated: >60 mL/min (ref >60)
Glucose, Bld: 150 mg/dL — ABNORMAL HIGH (ref 70–99)
Potassium: 4.5 mmol/L (ref 3.5–5.1)
Sodium: 136 mmol/L (ref 135–145)
Total Bilirubin: 0.5 mg/dL (ref 0.3–1.2)
Total Protein: 7.2 g/dL (ref 6.5–8.1)

## 2020-07-29 LAB — RESPIRATORY PANEL BY PCR

## 2020-07-29 LAB — ECHOCARDIOGRAM COMPLETE
Area-P 1/2: 4.71 cm2
Height: 67 in
S' Lateral: 2.6 cm
Weight: 3040 oz

## 2020-07-29 LAB — FERRITIN: Ferritin: 2281 ng/mL — ABNORMAL HIGH (ref 24–336)

## 2020-07-29 LAB — HEMOGLOBIN A1C
Hgb A1c MFr Bld: 5.5 % (ref 4.8–5.6)
Mean Plasma Glucose: 111.15 mg/dL

## 2020-07-29 LAB — HEPATITIS PANEL, ACUTE
HCV Ab: NONREACTIVE
Hep A IgM: NONREACTIVE
Hep B C IgM: NONREACTIVE
Hepatitis B Surface Ag: NONREACTIVE

## 2020-07-29 LAB — EXPECTORATED SPUTUM ASSESSMENT W GRAM STAIN, RFLX TO RESP C

## 2020-07-29 LAB — C-REACTIVE PROTEIN: CRP: 8.9 mg/dL — ABNORMAL HIGH (ref <1.0)

## 2020-07-29 LAB — PHOSPHORUS: Phosphorus: 4.3 mg/dL (ref 2.5–4.6)

## 2020-07-29 LAB — CBG MONITORING, ED
Glucose-Capillary: 162 mg/dL — ABNORMAL HIGH (ref 70–99)
Glucose-Capillary: 148 mg/dL — ABNORMAL HIGH (ref 70–99)

## 2020-07-29 LAB — STREP PNEUMONIAE URINARY ANTIGEN: Strep Pneumo Urinary Antigen: NEGATIVE

## 2020-07-29 LAB — MAGNESIUM: Magnesium: 2.6 mg/dL — ABNORMAL HIGH (ref 1.7–2.4)

## 2020-07-29 LAB — D-DIMER, QUANTITATIVE: D-Dimer, Quant: 3.67 ug/mL-FEU — ABNORMAL HIGH (ref 0.00–0.50)

## 2020-07-29 MED ORDER — ENOXAPARIN SODIUM 40 MG/0.4ML ~~LOC~~ SOLN
40.0000 mg | SUBCUTANEOUS | Status: DC
  Administered 2020-07-29: 40 mg via SUBCUTANEOUS
  Filled 2020-07-29: qty 0.4

## 2020-07-29 MED ORDER — INSULIN ASPART 100 UNIT/ML ~~LOC~~ SOLN
0.0000 [IU] | Freq: Three times a day (TID) | SUBCUTANEOUS | Status: DC
  Administered 2020-07-29: 2 [IU] via SUBCUTANEOUS
  Administered 2020-07-29: 1 [IU] via SUBCUTANEOUS
  Administered 2020-07-30: 3 [IU] via SUBCUTANEOUS
  Administered 2020-07-30: 1 [IU] via SUBCUTANEOUS
  Filled 2020-07-29: qty 0.09

## 2020-07-29 NOTE — ED Notes (Signed)
Called report to Brooke, RN

## 2020-07-29 NOTE — Progress Notes (Signed)
Bilateral lower extremity venous duplex has been completed. Preliminary results can be found in CV Proc through chart review.   07/29/20 11:35 AM Olen Cordial RVT

## 2020-07-29 NOTE — ED Notes (Signed)
Pt to take dinner tray upstairs with him.

## 2020-07-29 NOTE — Progress Notes (Signed)
*  PRELIMINARY RESULTS* Echocardiogram 2D Echocardiogram has been performed.  James Vaughan 07/29/2020, 2:40 PM

## 2020-07-29 NOTE — ED Notes (Signed)
Pt given meal tray.

## 2020-07-29 NOTE — Progress Notes (Signed)
PROGRESS NOTE    James Vaughan  CHE:[id number] DOB: January 03, 1986 DOA: 07/28/2020 PCP: Patient, No Pcp Per   Chief Complaint  Patient presents with  . Covid Positive  . Cough    Brief Narrative: James Vaughan is James Vaughan 35 y.o. male without notable significant medical history, not vaccinated for sars cov2 infection presents with worsening shortness of breath over the past several days.  Admitted for COVID 19 pneumonia.  Assessment & Plan:   Active Problems:   COVID-19  Acute Hypoxic Respiratory Failure 2/2 COVID 19 Pneumonia  Concern for Superimposed Bacterial Pneumonia  Sepsis 2/2 covid Tested positive 07/11/2020 Satting in 90's on 3 L - wean as tolerated sepsis ruled in with tachycardia, tachypnea/hypoxia, leukocytosis and in setting of covid CXR with interval progression of extensive multifocal airspace opacities L>R c/w multifocal pneumonia CT PE protocol with resp motion degradation limiting eval for PE (no central PE), multifocal pneumonia, mediastinal and hilar adenopathy Procalcitonin is elevated -> ceftriaxone/azithro.  Follow sputum cx, MRSA pcr, urine strep (negative), urine legionella (pending). Follow RVP >10 days from presentation, low likelihood of benefit from remdesivir Steroids with taper.  With elevated procal and modest O2 requirement, hold off on actemra/baricitinib for now. Therapeutic lovenox with elevated d dimer, SOB seems out of proportion to hypoxia, S1Q3T3 on EKG Strict I/O, daily weights Prone as able, IS, flutter, OOB, therapy  COVID-19 Labs  Recent Labs    07/28/20 1448 07/29/20 0522  DDIMER 5.52* 3.67*  FERRITIN 2,893* 2,281*  LDH 485*  --   CRP 10.6* 8.9*    Lab Results  Component Value Date   SARSCOV2NAA POSITIVE (Yehudit Fulginiti) 07/11/2020   Elevated D dimer  S1Q3T3 Pattern on EKG  Concern for VTE: dyspnea seems out of proportion to oxygen requirements.  S1Q3T3 pattern on EKG suggests RH strain which could be due to pneumonia vs PE. Therapeutic  lovenox LE [place], echo, CT PE motion degraded, no central PE Troponin, BNP (noth wnl)  Sinus Tachycardia: 2/2 above (? Possible flutter, though suspect sinus tach) follow on tele and serial EKG's Repeat EKG with sinus, IVCD, j point elevation, prolonged QTc  Prolonged QTc: avoid qt prolonging meds, follow  Lactic Acidosis  Anion Gap Metabolic Acidosis:resolved  Hyponatremia  Hypokalemia: replace and follow, follow mag - hyponatremia likely hypovolemia with poor intake  Chest Discomfort: negative troponins.  Likely pleuritic.  Low suspicion for ACS.  Elevated Blood Pressures: no formal diagnosis, monitor, possibly elevated with discomfort/SOB.  improved  Elevated LFT's: likely 2/2 covid, follow - acute hepatitis panel.  Normal bili.  Consider additional w/u as indicated. Improving  Thrombocytopenia: mild, follow, 2/2 covid  DVT prophylaxis: therapeutic lovenox Code Status: full  Family Communication: none at bedside Disposition:   Status is: Inpatient  Remains inpatient appropriate because:Inpatient level of care appropriate due to severity of illness   Dispo: The patient is from: Home              Anticipated d/c is to: Home              Anticipated d/c date is: 1 day              Patient currently is not medically stable to d/c.   Difficult to place patient No       Consultants:   none  Procedures:   none  Antimicrobials: Anti-infectives (From admission, onward)   Start     Dose/Rate Route Frequency Ordered Stop   07/29/20 1600  cefTRIAXone (ROCEPHIN) 2 g in sodium  chloride 0.9 % 100 mL IVPB        2 g 200 mL/hr over 30 Minutes Intravenous Every 24 hours 07/28/20 1729 08/03/20 1559   07/29/20 1600  azithromycin (ZITHROMAX) tablet 500 mg        500 mg Oral Every 24 hours 07/28/20 1729 08/02/20 1559   07/28/20 1700  cefTRIAXone (ROCEPHIN) 1 g in sodium chloride 0.9 % 100 mL IVPB        1 g 200 mL/hr over 30 Minutes Intravenous  Once 07/28/20 1650  07/28/20 1804   07/28/20 1700  azithromycin (ZITHROMAX) 500 mg in sodium chloride 0.9 % 250 mL IVPB        500 mg 250 mL/hr over 60 Minutes Intravenous  Once 07/28/20 1650 07/28/20 2053         Subjective: Feeling better today  Objective: Vitals:   07/29/20 0041 07/29/20 0045 07/29/20 0245 07/29/20 0545  BP: 137/82 137/76 128/77 115/78  Pulse: 81 84 75 82  Resp: (!) 28 (!) 27 (!) 30 (!) 30  Temp:      TempSrc:      SpO2: 92% 94% 94% 94%  Weight:      Height:        Intake/Output Summary (Last 24 hours) at 07/29/2020 9357 Last data filed at 07/28/2020 1804 Gross per 24 hour  Intake 1100 ml  Output --  Net 1100 ml   Filed Weights   07/28/20 1306  Weight: 86.2 kg    Examination:  General exam: Appears calm and comfortable  Respiratory system: bibasilar crackles, improved WOB from yesterday, less tachypneic Cardiovascular system: RRR Gastrointestinal system: Abdomen is nondistended, soft and nontender. Central nervous system: Alert and oriented. No focal neurological deficits. Extremities: no LEE Skin: No rashes, lesions or ulcers Psychiatry: Judgement and insight appear normal. Mood & affect appropriate.     Data Reviewed: I have personally reviewed following labs and imaging studies  CBC: Recent Labs  Lab 07/28/20 1448 07/29/20 0522  WBC 12.2* 6.6  NEUTROABS 8.8* 4.8  HGB 15.5 12.7*  HCT 44.8 37.2*  MCV 87.7 88.2  PLT 142* 120*    Basic Metabolic Panel: Recent Labs  Lab 07/28/20 1448 07/28/20 1800 07/29/20 0522  NA 131*  --  136  K 3.2*  --  4.5  CL 94*  --  103  CO2 21*  --  23  GLUCOSE 100*  --  150*  BUN 13  --  16  CREATININE 1.01  --  0.84  CALCIUM 8.6*  --  8.4*  MG  --  1.9 2.6*  PHOS  --   --  4.3    GFR: Estimated Creatinine Clearance: 132.3 mL/min (by C-G formula based on SCr of 0.84 mg/dL).  Liver Function Tests: Recent Labs  Lab 07/28/20 1448 07/29/20 0522  AST 72* 55*  ALT 159* 127*  ALKPHOS 53 45  BILITOT 0.9 0.5   PROT 8.2* 7.2  ALBUMIN 3.2* 2.6*    CBG: No results for input(s): GLUCAP in the last 168 hours.   No results found for this or any previous visit (from the past 240 hour(s)).       Radiology Studies: DG Chest 2 View  Result Date: 07/28/2020 CLINICAL DATA:  Shortness of breath, COVID symptoms EXAM: CHEST - 2 VIEW COMPARISON:  July 11, 2020 FINDINGS: The heart size and mediastinal contours are within normal limits. Extensive multifocal patchy airspace opacities are seen throughout both lungs, most notable within the periphery of the left  lung. No pleural effusion is seen. IMPRESSION: Interval progression of extensive multifocal airspace opacities, left greater than right, consistent with multifocal pneumonia Electronically Signed   By: Jonna Clark M.D.   On: 07/28/2020 13:25   CT ANGIO CHEST PE W OR WO CONTRAST  Result Date: 07/28/2020 CLINICAL DATA:  Shortness of breath COVID EXAM: CT ANGIOGRAPHY CHEST WITH CONTRAST TECHNIQUE: Multidetector CT imaging of the chest was performed using the standard protocol during bolus administration of intravenous contrast. Multiplanar CT image reconstructions and MIPs were obtained to evaluate the vascular anatomy. CONTRAST:  7mL OMNIPAQUE IOHEXOL 350 MG/ML SOLN COMPARISON:  Chest x-ray 07/28/2020 FINDINGS: Cardiovascular: Satisfactory opacification of the pulmonary arteries to the segmental level. Significant respiratory motion degradation, this limits evaluation for segmental and subsegmental PE. Allowing for this, no definitive acute filling defects are visualized. Aorta is nonaneurysmal. Normal cardiac size. No pericardial effusion Mediastinum/Nodes: Midline trachea. No thyroid mass. Mediastinal and hilar adenopathy. Right paratracheal lymph node measures 15 mm. AP window lymph node measures 12 mm. Bulky subcarinal node measuring 22 mm. Right hilar nodes measuring up to 15 mm and left hilar nodes measuring up to 2 cm. Esophagus grossly unremarkable.  Lungs/Pleura: Extensive bilateral lung consolidations. No pleural effusion or pneumothorax Upper Abdomen: Spleen appears slightly enlarged. No acute abnormality Musculoskeletal: No chest wall abnormality. No acute or significant osseous findings. Review of the MIP images confirms the above findings. IMPRESSION: 1. Significant respiratory motion degradation limits evaluation for pulmonary emboli. Allowing for this, no definite acute central pulmonary embolus is visualized. 2. Extensive bilateral lung consolidations consistent with multifocal pneumonia and history of COVID positivity. 3. Moderate mediastinal and hilar adenopathy, possibly reactive though lymphoproliferative disease is also possible. 4. Slightly enlarged spleen. Electronically Signed   By: Jasmine Pang M.D.   On: 07/28/2020 18:14        Scheduled Meds: . vitamin C  500 mg Oral Daily  . azithromycin  500 mg Oral Q24H  . enoxaparin (LOVENOX) injection  1 mg/kg Subcutaneous Q12H  . insulin aspart  0-9 Units Subcutaneous TID WC  . Ipratropium-Albuterol  1 puff Inhalation Q6H  . methylPREDNISolone (SOLU-MEDROL) injection  40 mg Intravenous Q6H   Followed by  . [START ON 07/31/2020] methylPREDNISolone (SOLU-MEDROL) injection  40 mg Intravenous Q12H  . pantoprazole  40 mg Oral Daily  . polyethylene glycol  17 g Oral Daily  . zinc sulfate  220 mg Oral Daily   Continuous Infusions: . cefTRIAXone (ROCEPHIN)  IV       LOS: 1 day    Time spent: over 30 min    Lacretia Nicks, MD [facility]   To contact the attending provider between 7A-7P or the covering provider during after hours 7P-7A, please log into the web site [email or web address] and access using universal [place] password for that web site. If you do not have the password, please call the hospital operator.  07/29/2020, 8:33 AM

## 2020-07-30 ENCOUNTER — Encounter (HOSPITAL_COMMUNITY): Payer: Self-pay | Admitting: Family Medicine

## 2020-07-30 ENCOUNTER — Inpatient Hospital Stay (HOSPITAL_COMMUNITY): Payer: Self-pay

## 2020-07-30 DIAGNOSIS — Z789 Other specified health status: Secondary | ICD-10-CM

## 2020-07-30 HISTORY — DX: Other specified health status: Z78.9

## 2020-07-30 LAB — CBC WITH DIFFERENTIAL/PLATELET
Abs Immature Granulocytes: 0.32 10*3/uL — ABNORMAL HIGH (ref 0.00–0.07)
Basophils Absolute: 0.1 10*3/uL (ref 0.0–0.1)
Basophils Relative: 1 %
Eosinophils Absolute: 0.1 10*3/uL (ref 0.0–0.5)
Eosinophils Relative: 0 %
HCT: 38 % — ABNORMAL LOW (ref 39.0–52.0)
Hemoglobin: 13.1 g/dL (ref 13.0–17.0)
Immature Granulocytes: 2 %
Lymphocytes Relative: 16 %
Lymphs Abs: 2.3 10*3/uL (ref 0.7–4.0)
MCH: 30.5 pg (ref 26.0–34.0)
MCHC: 34.5 g/dL (ref 30.0–36.0)
MCV: 88.4 fL (ref 80.0–100.0)
Monocytes Absolute: 0.6 10*3/uL (ref 0.1–1.0)
Monocytes Relative: 4 %
Neutro Abs: 11.6 10*3/uL — ABNORMAL HIGH (ref 1.7–7.7)
Neutrophils Relative %: 77 %
Platelets: 138 10*3/uL — ABNORMAL LOW (ref 150–400)
RBC: 4.3 MIL/uL (ref 4.22–5.81)
RDW: 13.8 % (ref 11.5–15.5)
WBC: 15 10*3/uL — ABNORMAL HIGH (ref 4.0–10.5)
nRBC: 0 % (ref 0.0–0.2)

## 2020-07-30 LAB — COMPREHENSIVE METABOLIC PANEL
ALT: 136 U/L — ABNORMAL HIGH (ref 0–44)
AST: 69 U/L — ABNORMAL HIGH (ref 15–41)
Albumin: 2.7 g/dL — ABNORMAL LOW (ref 3.5–5.0)
Alkaline Phosphatase: 51 U/L (ref 38–126)
Anion gap: 13 (ref 5–15)
BUN: 19 mg/dL (ref 6–20)
CO2: 20 mmol/L — ABNORMAL LOW (ref 22–32)
Calcium: 8.7 mg/dL — ABNORMAL LOW (ref 8.9–10.3)
Chloride: 106 mmol/L (ref 98–111)
Creatinine, Ser: 0.7 mg/dL (ref 0.61–1.24)
GFR, Estimated: >60 mL/min (ref >60)
Glucose, Bld: 148 mg/dL — ABNORMAL HIGH (ref 70–99)
Potassium: 3.8 mmol/L (ref 3.5–5.1)
Sodium: 139 mmol/L (ref 135–145)
Total Bilirubin: 0.3 mg/dL (ref 0.3–1.2)
Total Protein: 7 g/dL (ref 6.5–8.1)

## 2020-07-30 LAB — PHOSPHORUS: Phosphorus: 5.4 mg/dL — ABNORMAL HIGH (ref 2.5–4.6)

## 2020-07-30 LAB — MAGNESIUM: Magnesium: 2.4 mg/dL (ref 1.7–2.4)

## 2020-07-30 LAB — FERRITIN: Ferritin: 1469 ng/mL — ABNORMAL HIGH (ref 24–336)

## 2020-07-30 LAB — D-DIMER, QUANTITATIVE: D-Dimer, Quant: 6.55 ug/mL-FEU — ABNORMAL HIGH (ref 0.00–0.50)

## 2020-07-30 LAB — GLUCOSE, CAPILLARY
Glucose-Capillary: 145 mg/dL — ABNORMAL HIGH (ref 70–99)
Glucose-Capillary: 216 mg/dL — ABNORMAL HIGH (ref 70–99)

## 2020-07-30 LAB — C-REACTIVE PROTEIN: CRP: 4.4 mg/dL — ABNORMAL HIGH (ref <1.0)

## 2020-07-30 MED ORDER — AZITHROMYCIN 500 MG PO TABS: 500.0000 mg | ORAL_TABLET | Freq: Every day | ORAL | 0 refills | Status: AC

## 2020-07-30 MED ORDER — AMOXICILLIN 500 MG PO TABS: 1000.0000 mg | ORAL_TABLET | Freq: Three times a day (TID) | ORAL | 0 refills | Status: AC

## 2020-07-30 MED ORDER — IOHEXOL 350 MG/ML SOLN
100.0000 mL | Freq: Once | INTRAVENOUS | Status: AC | PRN
  Administered 2020-07-30: 100 mL via INTRAVENOUS

## 2020-07-30 MED ORDER — ALBUTEROL SULFATE HFA 108 (90 BASE) MCG/ACT IN AERS: 2.0000 | INHALATION_SPRAY | RESPIRATORY_TRACT | 1 refills | Status: AC | PRN

## 2020-07-30 MED ORDER — PREDNISONE 10 MG PO TABS: ORAL_TABLET | ORAL | 0 refills | Status: AC

## 2020-07-30 NOTE — Plan of Care (Signed)
°  Problem: Education: °Goal: Knowledge of risk factors and measures for prevention of condition will improve °Outcome: Not Progressing °  °

## 2020-07-30 NOTE — Progress Notes (Signed)
SATURATION QUALIFICATIONS: (This note is used to comply with regulatory documentation for home oxygen)  Patient Saturations on Room Air at Rest = 94%  Patient Saturations on Room Air while Ambulating = 88%  Patient Saturations on 0 Liters of oxygen while Ambulating = 88%  Please briefly explain why patient needs home oxygen: 

## 2020-07-30 NOTE — Progress Notes (Signed)
Initial Nutrition Assessment  DOCUMENTATION CODES:   Not applicable  INTERVENTION:  Nepro Shake po BID, each supplement provides 425 kcal and 19 grams protein  MVI with minerals daily  Unable to provide above at this time, orders have been reconciled for discharge  NUTRITION DIAGNOSIS:   Increased nutrient needs related to catabolic illness (COVID-19 pneumonia) as evidenced by estimated needs.  GOAL:   Patient will meet greater than or equal to 90% of their needs    MONITOR:   Labs,I & O's,Supplement acceptance,PO intake,Weight trends,Skin  REASON FOR ASSESSMENT:   Malnutrition Screening Tool    ASSESSMENT: 35 year old male without significant medical history admitted for COVID-19 pneumonia presented with worsening shortness of breath over the past couple of days.  RD working remotely.  Spoke with pt via phone, he reports feeling much better today and possibly going home pending results from CXR. He endorses very poor appetite that started on Monday, recalls eating fruit cups. Pt reports appetite is back to baseline and eating 100% of meals at this time. He recalls grilled chicken with mashed pots and gravy, pudding cup for lunch. Says he also was sent tomato soup, but did not eat because there was not a grilled cheese sandwich to go with it.   No weight history for review. Pt recalls usual weight around 215 lbs ~2 weeks ago. Per chart, he currently weighs 86.2 kg (189.64 lbs). He is 25 lbs (11.6%) under is reported usual weight which is significant. RD educated on increased needs secondary to catabolic illness and discussed the importance of adequate calorie/protien intake. Pt is agreeable to trying Nepro supplement to help him meet his needs.  No past weight history for review.  Medications reviewed and include: Vit C, Zithromax, SSI, Methylprednisolone, Protonix, Miralax, Zinc sulfate, Rocephin  Labs: CBGs 216,145, K 3.8 (WNL), Mg 2.4 (WNL), P 5.4 (H), WBC 15  (H)  NUTRITION - FOCUSED PHYSICAL EXAM:  Unable to complete at this time  Diet Order:   Diet Order            Diet - low sodium heart healthy           Diet regular Room service appropriate? Yes; Fluid consistency: Thin  Diet effective now                 EDUCATION NEEDS:   Education needs have been addressed  Skin:  Skin Assessment: Reviewed RN Assessment  Last BM:  1/28  Height:   Ht Readings from Last 1 Encounters:  07/28/20 5\' 7"  (1.702 m)    Weight:   Wt Readings from Last 1 Encounters:  07/28/20 86.2 kg    BMI:  Body mass index is 29.76 kg/m.  Estimated Nutritional Needs:   Kcal:  07/30/20  Protein:  >129 grams  Fluid:  >2.2 L   [id number], RD, LDN Clinical Nutrition After Hours/Weekend Pager # in Amion

## 2020-07-30 NOTE — Discharge Summary (Addendum)
Physician Discharge Summary  James Vaughan ZOX:[id number] DOB: Aug 31, 1985 DOA: 07/28/2020  PCP: Patient, No Pcp Per  Admit date: 07/28/2020 Discharge date: 07/30/2020  Time spent: 40 minutes  Recommendations for Outpatient Follow-up:  1. Follow outpatient CBC/CMP 2. Quarantine per [employer] guidelines 3. Follow outpatient with PCP for repeat CXR 4. Establish with PCP outpatient   5. Obtain vaccination when able  6. Follow mediastinal/hilar LAD outpatient (likely 2/2 infection) 7. Follow urine leigonella 8. Follow LFT's outpatient  Discharge Diagnoses:  Active Problems:   Sepsis due to COVID-19 [facility])   COVID-19   Discharge Condition: stable  Diet recommendation: heart healthy  Filed Weights   07/28/20 1306  Weight: 86.2 kg    History of present illness:  James Vaughan 35 y.o.malewithout notable significant medical history, not vaccinated for sars cov2 infection presents with worsening shortness of breath over the past several days.  Admitted for COVID 19 pneumonia.  He improved with steroids and antibiotics.  He had workup for VTE given elevated d dimer, but testing was negative for PE.  Discharged on 1/29 in stable conditions on abx and steroid taper.  See below for additonal details  Hospital Course:  Acute Hypoxic Respiratory Failure 2/2 COVID 19 Pneumonia  Concern for Superimposed Bacterial Pneumonia  Sepsis 2/2 covid Tested positive 07/11/2020 Satting in 90's on 3 L - wean as tolerated sepsis ruled in with tachycardia, tachypnea/hypoxia, leukocytosis and in setting of covid CXR with interval progression of extensive multifocal airspace opacities L>R c/w multifocal pneumonia CT PE protocol with resp motion degradation limiting eval for PE (no central PE), multifocal pneumonia, mediastinal and hilar adenopathy CT 1/29 without evidence fo PE, modate bilateral peripheral predominant consolidation and GGO decreased since 1/27 Procalcitonin is elevated  ->ceftriaxone/azithro -> d/c with amox/azithro. Follow sputum cx, MRSA pcr, urine strep (negative), urine legionella (pending). Follow RVP negative >10 days from presentation, low likelihood of benefit from remdesivir Steroids with taper at discharge. With elevated procal and modest O2 requirement, hold off on actemra/baricitinib for now. Now on ppx lovenox Strict I/O, daily weights Prone as able, IS, flutter, OOB, therapy  COVID-19 Labs  Recent Labs    07/28/20 1448 07/29/20 0522 07/30/20 0429 07/30/20 0434  DDIMER 5.52* 3.67* 6.55*  --   FERRITIN 2,893* 2,281*  --  1,469*  LDH 485*  --   --   --   CRP 10.6* 8.9*  --  4.4*    Lab Results  Component Value Date   SARSCOV2NAA POSITIVE (Rachana Malesky) 07/11/2020   Elevated D dimer  S1Q3T3 Pattern on EKG  Concern for VTE: dyspnea seems out of proportion to oxygen requirements. S1Q3T3 pattern on EKG suggests RH strain which could be due to pneumonia vs PE. CT without evidence of PE from 1/29.  LE [place] negative for DVT.  Echo with normal RVSF.  Negative workup for VTE.  D dimer likely elevated in setting of inflammation from covid infection.  No indication for anticoagulation at this time, follow outpatient. Troponin, BNP (noth wnl)  Sinus Tachycardia:2/2 above (? Possible flutter, though suspect sinus tach) follow on tele and serial EKG's Repeat EKG with sinus, IVCD, j point elevation, prolonged QTc  Prolonged QTc: avoid qt prolonging meds, follow  Lactic Acidosis  Anion Gap Metabolic Acidosis:resolved  Hyponatremia  Hypokalemia: replace and follow, follow mag - hyponatremia likely hypovolemia with poor intake  Chest Discomfort: negative troponins.  Likely pleuritic.  Low suspicion for ACS.  Elevated Blood Pressures:no formal diagnosis, monitor, possibly elevated with discomfort/SOB.  improved  Elevated LFT's:likely 2/2 covid, follow - acute hepatitis panel (negative). Normal bili. Consider additional w/u as indicated  otpatient   Thrombocytopenia:mild, follow, 2/2 covid  Procedures: Echo IMPRESSIONS    1. Left ventricular ejection fraction, by estimation, is 65 to 70%. The  left ventricle has normal function. The left ventricle has no regional  wall motion abnormalities. Left ventricular diastolic parameters were  normal.  2. Right ventricular systolic function is normal. The right ventricular  size is normal.  3. The mitral valve is grossly normal. No evidence of mitral valve  regurgitation.  4. The aortic valve is tricuspid. Aortic valve regurgitation is not  visualized.  5. The inferior vena cava is normal in size with greater than 50%  respiratory variability, suggesting right atrial pressure of 3 mmHg.   Comparison(s): No prior Echocardiogram.   LE [place] Summary:  RIGHT:  - There is no evidence of deep vein thrombosis in the lower extremity.    - No cystic structure found in the popliteal fossa.    LEFT:  - There is no evidence of deep vein thrombosis in the lower extremity.    - No cystic structure found in the popliteal fossa.     *See table(s) above for measurements and observations.   Consultations:  none  Discharge Exam: Vitals:   07/30/20 0141 07/30/20 1200  BP: 129/83   Pulse: 92   Resp: 18   Temp: 97.6 F (36.4 C)   SpO2: 96% 94%   Feeling better Eager to go home  General: No acute distress. Cardiovascular: Heart sounds show Shigeko Manard regular rate, and rhythm Lungs: bibasilar crackles, mild tachypnea Abdomen: Soft, nontender, nondistended  Neurological: Alert and oriented 3. Moves all extremities 4 . Cranial nerves II through XII grossly intact. Skin: Warm and dry. No rashes or lesions. Extremities: No clubbing or cyanosis. No edema.   Discharge Instructions   Discharge Instructions    Call MD for:  difficulty breathing, headache or visual disturbances   Complete by: As directed    Call MD for:  extreme fatigue   Complete by: As directed     Call MD for:  hives   Complete by: As directed    Call MD for:  persistant dizziness or light-headedness   Complete by: As directed    Call MD for:  persistant nausea and vomiting   Complete by: As directed    Call MD for:  redness, tenderness, or signs of infection (pain, swelling, redness, odor or green/yellow discharge around incision site)   Complete by: As directed    Call MD for:  severe uncontrolled pain   Complete by: As directed    Call MD for:  temperature >100.4   Complete by: As directed    Diet - low sodium heart healthy   Complete by: As directed    Discharge instructions   Complete by: As directed    You were seen for COVID 19 pneumonia.  You've improved with steroids and antibiotics.  We'll discharge you with antibiotics and Mackinzie Vuncannon steroid taper.  You'll need to quarantine for 21 days from your positive test.  Your quarantine can discontinue on 08/01/2020 if you've continued to improve.    You should have Hanifa Antonetti follow up chest x ray in 3-4 weeks to follow your abnormal chest x ray.    Your liver enzymes were abnormal.  Please follow up with Cherita Hebel PCP outpatient for repeat labs.   Follow up your abnormal imaging findings outpatient with Charlei Ramsaran PCP to discuss  follow up.  Please discuss with your PCP timing regarding getting vaccinated for COVID.  Return for new, recurrent, or worsening symptoms.  Please ask your PCP to request records from this hospitalization so they know what was done and what the next steps will be.   Increase activity slowly   Complete by: As directed      Allergies as of 07/30/2020   No Known Allergies     Medication List    TAKE these medications   albuterol 108 (90 Base) MCG/ACT inhaler Commonly known as: VENTOLIN HFA Inhale 2 puffs into the lungs every 2 (two) hours as needed for wheezing or shortness of breath.   amoxicillin 500 MG tablet Commonly known as: AMOXIL Take 2 tablets (1,000 mg total) by mouth 3 (three) times daily for 3 days.    azithromycin 500 MG tablet Commonly known as: ZITHROMAX Take 1 tablet (500 mg total) by mouth daily for 3 days.   predniSONE 10 MG tablet Commonly known as: DELTASONE Take 4 tablets (40 mg total) by mouth daily for 5 days, THEN 3 tablets (30 mg total) daily for 1 day, THEN 2 tablets (20 mg total) daily for 1 day, THEN 1 tablet (10 mg total) daily for 1 day. Start taking on: July 30, 2020      No Known Allergies    The results of significant diagnostics from this hospitalization (including imaging, microbiology, ancillary and laboratory) are listed below for reference.    Significant Diagnostic Studies: DG Chest 2 View  Result Date: 07/28/2020 CLINICAL DATA:  Shortness of breath, COVID symptoms EXAM: CHEST - 2 VIEW COMPARISON:  July 11, 2020 FINDINGS: The heart size and mediastinal contours are within normal limits. Extensive multifocal patchy airspace opacities are seen throughout both lungs, most notable within the periphery of the left lung. No pleural effusion is seen. IMPRESSION: Interval progression of extensive multifocal airspace opacities, left greater than right, consistent with multifocal pneumonia Electronically Signed   By: Jonna Clark M.D.   On: 07/28/2020 13:25   CT ANGIO CHEST PE W OR WO CONTRAST  Result Date: 07/30/2020 CLINICAL DATA:  COVID positive, shortness of breath. EXAM: CT ANGIOGRAPHY CHEST WITH CONTRAST TECHNIQUE: Multidetector CT imaging of the chest was performed using the standard protocol during bolus administration of intravenous contrast. Multiplanar CT image reconstructions and MIPs were obtained to evaluate the vascular anatomy. CONTRAST:  OMNIPAQUE IOHEXOL 350 MG/ML SOLN COMPARISON:  CT chest dated 05/28/2021. FINDINGS: Cardiovascular: Satisfactory opacification of the pulmonary arteries to the segmental level. No evidence of pulmonary embolism. Normal heart size. No pericardial effusion. Mediastinum/Nodes: Mediastinal and hilar adenopathy is  decreased from prior exam and likely reactive. For reference, Montrice Gracey subcarinal lymph node measures 14 mm in short axis compared to 22 mm on prior exam. The esophagus, thyroid, and trachea appear normal. Lungs/Pleura: Moderate bilateral peripheral predominant consolidation and ground-glass opacities have decreased since 07/28/2020, consistent with COVID-19 pneumonia. There is no pleural effusion or pneumothorax. Upper Abdomen: No acute abnormality. Musculoskeletal: No chest wall abnormality. No acute or significant osseous findings. Review of the MIP images confirms the above findings. IMPRESSION: 1. No evidence of pulmonary embolism. 2. Moderate bilateral peripheral predominant consolidation and ground-glass opacities have decreased since 07/28/2020, consistent with COVID-19 pneumonia. Electronically Signed   By: Romona Curls M.D.   On: 07/30/2020 13:44   CT ANGIO CHEST PE W OR WO CONTRAST  Result Date: 07/28/2020 CLINICAL DATA:  Shortness of breath COVID EXAM: CT ANGIOGRAPHY CHEST WITH CONTRAST TECHNIQUE: Multidetector  CT imaging of the chest was performed using the standard protocol during bolus administration of intravenous contrast. Multiplanar CT image reconstructions and MIPs were obtained to evaluate the vascular anatomy. CONTRAST:  75mL OMNIPAQUE IOHEXOL 350 MG/ML SOLN COMPARISON:  Chest x-ray 07/28/2020 FINDINGS: Cardiovascular: Satisfactory opacification of the pulmonary arteries to the segmental level. Significant respiratory motion degradation, this limits evaluation for segmental and subsegmental PE. Allowing for this, no definitive acute filling defects are visualized. Aorta is nonaneurysmal. Normal cardiac size. No pericardial effusion Mediastinum/Nodes: Midline trachea. No thyroid mass. Mediastinal and hilar adenopathy. Right paratracheal lymph node measures 15 mm. AP window lymph node measures 12 mm. Bulky subcarinal node measuring 22 mm. Right hilar nodes measuring up to 15 mm and left hilar nodes  measuring up to 2 cm. Esophagus grossly unremarkable. Lungs/Pleura: Extensive bilateral lung consolidations. No pleural effusion or pneumothorax Upper Abdomen: Spleen appears slightly enlarged. No acute abnormality Musculoskeletal: No chest wall abnormality. No acute or significant osseous findings. Review of the MIP images confirms the above findings. IMPRESSION: 1. Significant respiratory motion degradation limits evaluation for pulmonary emboli. Allowing for this, no definite acute central pulmonary embolus is visualized. 2. Extensive bilateral lung consolidations consistent with multifocal pneumonia and history of COVID positivity. 3. Moderate mediastinal and hilar adenopathy, possibly reactive though lymphoproliferative disease is also possible. 4. Slightly enlarged spleen. Electronically Signed   By: Jasmine PangKim  Fujinaga M.D.   On: 07/28/2020 18:14   DG Chest Portable 1 View  Result Date: 07/11/2020 CLINICAL DATA:  Shortness of breath and cough EXAM: PORTABLE CHEST 1 VIEW COMPARISON:  None. FINDINGS: Low lung volumes are present, causing crowding of the pulmonary vasculature. Bibasilar airspace opacities are present, left greater than right. Airway thickening is also noted. Heart size within normal limits. No blunting of the costophrenic angles. IMPRESSION: 1. Bibasilar airspace opacities, left greater than right, suspicious for pneumonia. 2. Airway thickening is also noted, suggesting bronchitis or reactive airways disease. Electronically Signed   By: Gaylyn RongWalter  Liebkemann M.D.   On: 07/11/2020 17:26   ECHOCARDIOGRAM COMPLETE  Result Date: 07/29/2020    ECHOCARDIOGRAM REPORT   Patient Name:   Clide DalesOSCAR Babler Date of Exam: 07/29/2020 Medical Rec #:  [id number]       Height:       67.0 in Accession #:    [id number]      Weight:       190.0 lb Date of Birth:  1985/11/10       BSA:          1.979 m Patient Age:    34 years        BP:           128/75 mmHg Patient Gender: M               HR:           96 bpm. Exam  Location:  Inpatient Procedure: 2D Echo Indications:    Positive D dimer [[id number]]  History:        Patient has no prior history of Echocardiogram examinations.                 Sepsis due to Covid 19, Acute Respiratory Failure.  Sonographer:    Jeryl ColumbiaJohanna Elliott Referring Phys: [phone] Trinitie Mcgirr CALDWELL POWELL JR IMPRESSIONS  1. Left ventricular ejection fraction, by estimation, is 65 to 70%. The left ventricle has normal function. The left ventricle has no regional wall motion abnormalities. Left ventricular diastolic parameters were normal.  2. Right ventricular  systolic function is normal. The right ventricular size is normal.  3. The mitral valve is grossly normal. No evidence of mitral valve regurgitation.  4. The aortic valve is tricuspid. Aortic valve regurgitation is not visualized.  5. The inferior vena cava is normal in size with greater than 50% respiratory variability, suggesting right atrial pressure of 3 mmHg. Comparison(s): No prior Echocardiogram. FINDINGS  Left Ventricle: Left ventricular ejection fraction, by estimation, is 65 to 70%. The left ventricle has normal function. The left ventricle has no regional wall motion abnormalities. The left ventricular internal cavity size was normal in size. There is  no left ventricular hypertrophy. Left ventricular diastolic parameters were normal. Right Ventricle: The right ventricular size is normal. No increase in right ventricular wall thickness. Right ventricular systolic function is normal. Left Atrium: Left atrial size was normal in size. Right Atrium: Right atrial size was normal in size. Pericardium: There is no evidence of pericardial effusion. Mitral Valve: The mitral valve is grossly normal. No evidence of mitral valve regurgitation. Tricuspid Valve: The tricuspid valve is grossly normal. Tricuspid valve regurgitation is trivial. Aortic Valve: The aortic valve is tricuspid. Aortic valve regurgitation is not visualized. Pulmonic Valve: The pulmonic valve was  normal in structure. Pulmonic valve regurgitation is not visualized. Aorta: The aortic root and ascending aorta are structurally normal, with no evidence of dilitation. Venous: The inferior vena cava is normal in size with greater than 50% respiratory variability, suggesting right atrial pressure of 3 mmHg. IAS/Shunts: No atrial level shunt detected by color flow Doppler.  LEFT VENTRICLE PLAX 2D LVIDd:         4.50 cm  Diastology LVIDs:         2.60 cm  LV e' medial:    11.70 cm/s LV PW:         1.40 cm  LV E/e' medial:  4.3 LV IVS:        0.90 cm  LV e' lateral:   11.10 cm/s LVOT diam:     2.10 cm  LV E/e' lateral: 4.6 LVOT Area:     3.46 cm  RIGHT VENTRICLE RV S prime:     12.90 cm/s TAPSE (M-mode): 2.3 cm LEFT ATRIUM             Index       RIGHT ATRIUM          Index LA diam:        3.60 cm 1.82 cm/m  RA Area:     8.30 cm LA Vol (A2C):   21.7 ml 10.97 ml/m RA Volume:   15.00 ml 7.58 ml/m LA Vol (A4C):   18.2 ml 9.20 ml/m LA Biplane Vol: 20.7 ml 10.46 ml/m   AORTA Ao Root diam: 2.50 cm MITRAL VALVE MV Area (PHT): 4.71 cm    SHUNTS MV Decel Time: 161 msec    Systemic Diam: 2.10 cm MV E velocity: 50.70 cm/s MV Goddess Gebbia velocity: 53.50 cm/s MV E/Ebony Yorio ratio:  0.95 Zoila Shutter MD Electronically signed by Zoila Shutter MD Signature Date/Time: 07/29/2020/3:25:59 PM    Final    VAS [place] LOWER EXTREMITY VENOUS (DVT)  Result Date: 07/29/2020  Lower Venous DVT Study Indications: Elevated Ddimer.  Risk Factors: COVID 19 positive. Comparison Study: No prior studies. Performing Technologist: Chanda Busing RVT  Examination Guidelines: Armya Westerhoff complete evaluation includes B-mode imaging, spectral Doppler, color Doppler, and power Doppler as needed of all accessible portions of each vessel. Bilateral testing is considered an integral part of Kellee Sittner  complete examination. Limited examinations for reoccurring indications may be performed as noted. The reflux portion of the exam is performed with the patient in reverse Trendelenburg.   +---------+---------------+---------+-----------+----------+--------------+ RIGHT    CompressibilityPhasicitySpontaneityPropertiesThrombus Aging +---------+---------------+---------+-----------+----------+--------------+ CFV      Full           Yes      Yes                                 +---------+---------------+---------+-----------+----------+--------------+ SFJ      Full                                                        +---------+---------------+---------+-----------+----------+--------------+ FV Prox  Full                                                        +---------+---------------+---------+-----------+----------+--------------+ FV Mid   Full                                                        +---------+---------------+---------+-----------+----------+--------------+ FV DistalFull                                                        +---------+---------------+---------+-----------+----------+--------------+ PFV      Full                                                        +---------+---------------+---------+-----------+----------+--------------+ POP      Full           Yes      Yes                                 +---------+---------------+---------+-----------+----------+--------------+ PTV      Full                                                        +---------+---------------+---------+-----------+----------+--------------+ PERO     Full                                                        +---------+---------------+---------+-----------+----------+--------------+   +---------+---------------+---------+-----------+----------+--------------+ LEFT     CompressibilityPhasicitySpontaneityPropertiesThrombus Aging +---------+---------------+---------+-----------+----------+--------------+ CFV      Full  Yes      Yes                                  +---------+---------------+---------+-----------+----------+--------------+ SFJ      Full                                                        +---------+---------------+---------+-----------+----------+--------------+ FV Prox  Full                                                        +---------+---------------+---------+-----------+----------+--------------+ FV Mid   Full                                                        +---------+---------------+---------+-----------+----------+--------------+ FV DistalFull                                                        +---------+---------------+---------+-----------+----------+--------------+ PFV      Full                                                        +---------+---------------+---------+-----------+----------+--------------+ POP      Full           Yes      Yes                                 +---------+---------------+---------+-----------+----------+--------------+ PTV      Full                                                        +---------+---------------+---------+-----------+----------+--------------+ PERO     Full                                                        +---------+---------------+---------+-----------+----------+--------------+     Summary: RIGHT: - There is no evidence of deep vein thrombosis in the lower extremity.  - No cystic structure found in the popliteal fossa.  LEFT: - There is no evidence of deep vein thrombosis in the lower extremity.  - No cystic structure found in the popliteal fossa.  *See table(s) above for measurements and observations. Electronically signed by Heath Lark on 07/29/2020 at 2:43:04 PM.  Final     Microbiology: Recent Results (from the past 240 hour(s))  Blood Culture (routine x 2)     Status: None (Preliminary result)   Collection Time: 07/28/20  2:48 PM   Specimen: BLOOD  Result Value Ref Range Status   Specimen Description    Final    BLOOD RIGHT ARM Performed at [facility], [address], [place], [place] [address]    Special Requests   Final    BOTTLES DRAWN AEROBIC AND ANAEROBIC Blood Culture adequate volume Performed at [facility], [address], [place], [place] [address]    Culture   Final    NO GROWTH < 24 HOURS Performed at [facility], [address] [place], [place] [address]    Report Status PENDING  Incomplete  Culture, sputum-assessment     Status: None   Collection Time: 07/29/20  9:46 AM   Specimen: Expectorated Sputum  Result Value Ref Range Status   Specimen Description EXPECTORATED SPUTUM  Final   Special Requests NONE  Final   Sputum evaluation   Final    Sputum specimen not acceptable for testing.  Please recollect.   INFORMED [place], RN ED @1104  ON 1.28.2022 BY [facility] Performed at [facility], [address], [place], [place] [address]    Report Status 07/29/2020 FINAL  Final  Respiratory (~20 pathogens) panel by PCR     Status: None   Collection Time: 07/29/20 10:52 AM   Specimen: Nasopharyngeal Swab; Respiratory  Result Value Ref Range Status   Adenovirus NOT DETECTED NOT DETECTED Final   Coronavirus 229E NOT DETECTED NOT DETECTED Final    Comment: (NOTE) The Coronavirus on the Respiratory Panel, DOES NOT test for the novel  Coronavirus (2019 nCoV)    Coronavirus HKU1 NOT DETECTED NOT DETECTED Final   Coronavirus NL63 NOT DETECTED NOT DETECTED Final   Coronavirus OC43 NOT DETECTED NOT DETECTED Final   Metapneumovirus NOT DETECTED NOT DETECTED Final   Rhinovirus / Enterovirus NOT DETECTED NOT DETECTED Final   Influenza Arturo Freundlich NOT DETECTED NOT DETECTED Final   Influenza B NOT DETECTED NOT DETECTED Final   Parainfluenza Virus 1 NOT DETECTED NOT DETECTED Final   Parainfluenza Virus 2 NOT DETECTED NOT DETECTED Final   Parainfluenza Virus 3 NOT DETECTED NOT DETECTED Final   Parainfluenza Virus 4 NOT DETECTED  NOT DETECTED Final   Respiratory Syncytial Virus NOT DETECTED NOT DETECTED Final   Bordetella pertussis NOT DETECTED NOT DETECTED Final   Bordetella Parapertussis NOT DETECTED NOT DETECTED Final   Chlamydophila pneumoniae NOT DETECTED NOT DETECTED Final   Mycoplasma pneumoniae NOT DETECTED NOT DETECTED Final    Comment: Performed at [facility], [address] [place], [place] [address]     Labs: Basic Metabolic Panel: Recent Labs  Lab 07/28/20 1448 07/28/20 1800 07/29/20 0522 07/30/20 0429  NA 131*  --  136 139  K 3.2*  --  4.5 3.8  CL 94*  --  103 106  CO2 21*  --  23 20*  GLUCOSE 100*  --  150* 148*  BUN 13  --  16 19  CREATININE 1.01  --  0.84 0.70  CALCIUM 8.6*  --  8.4* 8.7*  MG  --  1.9 2.6* 2.4  PHOS  --   --  4.3 5.4*   Liver Function Tests: Recent Labs  Lab 07/28/20 1448 07/29/20 0522 07/30/20 0429  AST 72* 55* 69*  ALT 159* 127*  136*  ALKPHOS 53 45 51  BILITOT 0.9 0.5 0.3  PROT 8.2* 7.2 7.0  ALBUMIN 3.2* 2.6* 2.7*   No results for input(s): LIPASE, AMYLASE in the last 168 hours. No results for input(s): AMMONIA in the last 168 hours. CBC: Recent Labs  Lab 07/28/20 1448 07/29/20 0522 07/30/20 0429  WBC 12.2* 6.6 15.0*  NEUTROABS 8.8* 4.8 11.6*  HGB 15.5 12.7* 13.1  HCT 44.8 37.2* 38.0*  MCV 87.7 88.2 88.4  PLT 142* 120* 138*   Cardiac Enzymes: No results for input(s): CKTOTAL, CKMB, CKMBINDEX, TROPONINI in the last 168 hours. BNP: BNP (last 3 results) Recent Labs    07/28/20 1448  BNP 17.5    ProBNP (last 3 results) No results for input(s): PROBNP in the last 8760 hours.  CBG: Recent Labs  Lab 07/29/20 0935 07/29/20 1233 07/30/20 0806 07/30/20 1118  GLUCAP 162* 148* 145* 216*       Signed:  Lacretia Nicks MD.  [facility] 07/30/2020, 2:34 PM

## 2020-08-01 LAB — LEGIONELLA PNEUMOPHILA SEROGP 1 UR AG: L. pneumophila Serogp 1 Ur Ag: NEGATIVE

## 2020-08-02 LAB — CULTURE, BLOOD (ROUTINE X 2)
Culture: NO GROWTH
Special Requests: ADEQUATE

## 2021-04-28 ENCOUNTER — Emergency Department (HOSPITAL_COMMUNITY): Payer: Medicaid Other

## 2021-04-28 ENCOUNTER — Encounter (HOSPITAL_COMMUNITY): Payer: Self-pay

## 2021-04-28 ENCOUNTER — Other Ambulatory Visit: Payer: Self-pay

## 2021-04-28 ENCOUNTER — Emergency Department (HOSPITAL_COMMUNITY)
Admission: EM | Admit: 2021-04-28 | Discharge: 2021-04-28 | Disposition: A | Discharge status: Home / Self Care | Payer: Medicaid Other | Attending: Emergency Medicine | Admitting: Emergency Medicine

## 2021-04-28 DIAGNOSIS — R2242 Localized swelling, mass and lump, left lower limb: Secondary | ICD-10-CM

## 2021-04-28 DIAGNOSIS — B353 Tinea pedis: Secondary | ICD-10-CM | POA: Insufficient documentation

## 2021-04-28 DIAGNOSIS — L03116 Cellulitis of left lower limb: Secondary | ICD-10-CM | POA: Insufficient documentation

## 2021-04-28 MED ORDER — DOXYCYCLINE HYCLATE 100 MG PO CAPS: 100.0000 mg | ORAL_CAPSULE | Freq: Two times a day (BID) | ORAL | 0 refills | Status: AC

## 2021-04-28 MED ORDER — CLOTRIMAZOLE 1 % EX CREA: TOPICAL_CREAM | CUTANEOUS | 0 refills | Status: AC

## 2021-04-28 NOTE — ED Triage Notes (Signed)
Patient c/o swelling and pain to the left foot x 4 days. Patient denies any injuries at this time.

## 2021-04-28 NOTE — Discharge Instructions (Signed)
Return for any problem.  ?

## 2021-04-28 NOTE — ED Provider Notes (Signed)
[facility]-EMERGENCY DEPT Provider Note   CSN: [id number] Arrival date & time: 04/28/21  0846     History Chief Complaint  Patient presents with   Foot Swelling    James Vaughan is a 35 y.o. male.  35 year old male with prior medical history as detailed below presents for evaluation.  Patient with complaint of pain and swelling to the left foot.  Symptoms started 4 days prior.  Patient with long history of athletes foot.  He does not use any topical or oral antifungal regularly.  He denies injury to the foot.  He denies fever.  He denies other complaint.  He has not taken any medications for his symptoms.  The history is provided by the patient.  Illness Location:  Left foot pain Severity:  Mild Onset quality:  Gradual Duration:  4 days Timing:  Constant Progression:  Worsening Chronicity:  New     Past Medical History:  Diagnosis Date   Known health problems: none 07/30/2020    Patient Active Problem List   Diagnosis Date Noted   COVID-19 virus infection 07/29/2020   Thrombocytopenia (HCC)    Tachycardia    Sepsis due to COVID-19 (HCC) 07/28/2020   Acute respiratory failure with hypoxia (HCC)     History reviewed. No pertinent surgical history.     Family History  Problem Relation Age of Onset   Hypertension Mother    Diabetes Mother    Heart murmur Mother     Social History   Tobacco Use   Smoking status: Former    Types: Cigarettes    Quit date: 2006    Years since quitting: 16.8   Smokeless tobacco: Never  Vaping Use   Vaping Use: Never used  Substance Use Topics   Alcohol use: Yes   Drug use: Never    Home Medications Prior to Admission medications   Medication Sig Start Date End Date Taking? Authorizing Provider  clotrimazole (LOTRIMIN) 1 % cream Apply to affected area 2 times daily 04/28/21  Yes Evans Levee, Noralyn Pick, MD  doxycycline (VIBRAMYCIN) 100 MG capsule Take 1 capsule (100 mg total) by mouth 2 (two) times  daily. 04/28/21  Yes Wynetta Fines, MD  albuterol (VENTOLIN HFA) 108 (90 Base) MCG/ACT inhaler Inhale 2 puffs into the lungs every 2 (two) hours as needed for wheezing or shortness of breath. 07/30/20 08/29/20  Zigmund Daniel., MD    Allergies    Patient has no known allergies.  Review of Systems   Review of Systems  All other systems reviewed and are negative.  Physical Exam Updated Vital Signs BP (!) 167/97 (BP Location: Right Arm)   Pulse 70   Temp 98.7 F (37.1 C) (Oral)   Resp 16   Ht 5\' 9"  (1.753 m)   Wt 98.4 kg   SpO2 99%   BMI 32.05 kg/m   Physical Exam Vitals and nursing note reviewed.  Constitutional:      General: He is not in acute distress.    Appearance: Normal appearance. He is well-developed.  HENT:     Head: Normocephalic and atraumatic.  Eyes:     Conjunctiva/sclera: Conjunctivae normal.     Pupils: Pupils are equal, round, and reactive to light.  Cardiovascular:     Rate and Rhythm: Normal rate and regular rhythm.     Heart sounds: Normal heart sounds.  Pulmonary:     Effort: Pulmonary effort is normal. No respiratory distress.     Breath sounds: Normal  breath sounds.  Abdominal:     General: There is no distension.     Palpations: Abdomen is soft.     Tenderness: There is no abdominal tenderness.  Musculoskeletal:        General: No deformity. Normal range of motion.     Cervical back: Normal range of motion and neck supple.  Skin:    General: Skin is warm and dry.     Comments: Left foot - skin changes c/w Tinea pedis noted between all toes of left foot - mild erythema and tenderness to web space between 3rd and 4th toes, consistent with early cellulitis.   Neurological:     General: No focal deficit present.     Mental Status: He is alert and oriented to person, place, and time.    ED Results / Procedures / Treatments   Labs (all labs ordered are listed, but only abnormal results are displayed) Labs Reviewed - No data to  display  EKG None  Radiology DG Foot Complete Left  Result Date: 04/28/2021 CLINICAL DATA:  Middle toe pain. EXAM: LEFT FOOT - COMPLETE 3+ VIEW COMPARISON:  None. FINDINGS: No fracture or dislocation of mid foot or forefoot. The phalanges are normal. The calcaneus is normal. No soft tissue abnormality. IMPRESSION: No fracture or dislocation. Electronically Signed   By: Genevive Bi M.D.   On: 04/28/2021 09:39    Procedures Procedures   Medications Ordered in ED Medications - No data to display  ED Course  I have reviewed the triage vital signs and the nursing notes.  Pertinent labs & imaging results that were available during my care of the patient were reviewed by me and considered in my medical decision making (see chart for details).    MDM Rules/Calculators/A&P                           MDM  MSE complete  James Vaughan was evaluated in Emergency Department on 04/28/2021 for the symptoms described in the history of present illness. He was evaluated in the context of the global COVID-19 pandemic, which necessitated consideration that the patient might be at risk for infection with the SARS-CoV-2 virus that causes COVID-19. Institutional protocols and algorithms that pertain to the evaluation of patients at risk for COVID-19 are in a state of rapid change based on information released by regulatory bodies including the CDC and federal and state organizations. These policies and algorithms were followed during the patient's care in the ED.    Patient with left foot pain.  Hx and exam suggestive of tinea pedis with resulting cellulitis.   Will start course of outpatient abx and strongly recommend use of topical anti-fungals.   Strict return precautions given and understood. Importance of close follow up is repeatedly stressed.    Final Clinical Impression(s) / ED Diagnoses Final diagnoses:  Localized swelling of left foot  Cellulitis of left foot  Tinea pedis of  left foot    Rx / DC Orders ED Discharge Orders          Ordered    clotrimazole (LOTRIMIN) 1 % cream        04/28/21 1029    doxycycline (VIBRAMYCIN) 100 MG capsule  2 times daily        04/28/21 1029             Wynetta Fines, MD 04/28/21 1037

## 2021-08-12 IMAGING — CT CT ANGIO CHEST
2 of 7 series · 17 of 46 positions shown · IV contrast (APPLIED)
Comparison: CT chest dated 05/28/2021.

CLINICAL DATA: COVID positive, shortness of breath.

EXAM:
CT ANGIOGRAPHY CHEST WITH CONTRAST
TECHNIQUE: Multidetector CT imaging of the chest was performed using the
standard protocol during bolus administration of intravenous
contrast. Multiplanar CT image reconstructions and MIPs were
obtained to evaluate the vascular anatomy.
CONTRAST:  100mL OMNIPAQUE IOHEXOL 350 MG/ML SOLN

[Series 5: thins · axial · 0.80mm/px · z∈[+994,+1272]mm · 15 of 318 slices shown]
[im 20/318  lung]
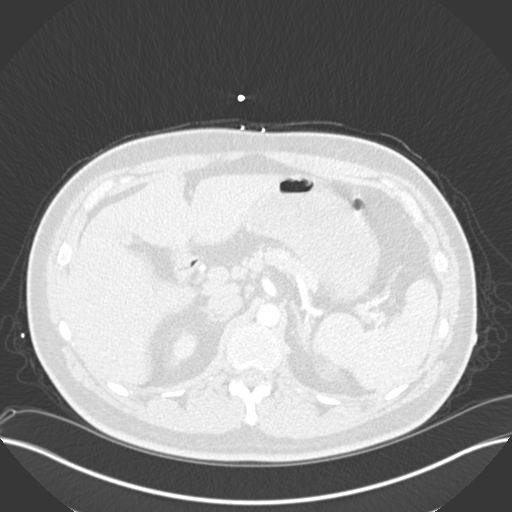
[im 40/318  soft-tissue]
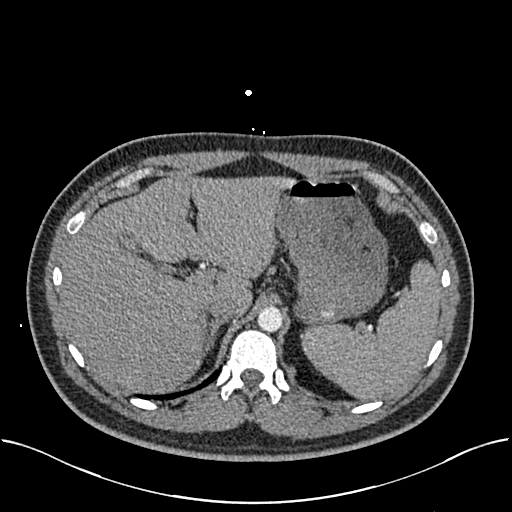
[im 60/318  lung]
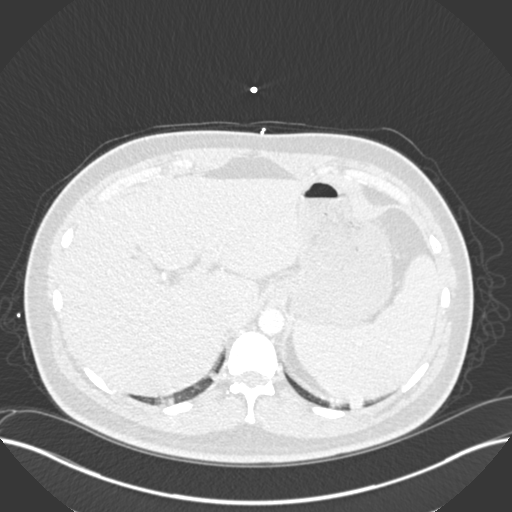
[im 80/318  soft-tissue]
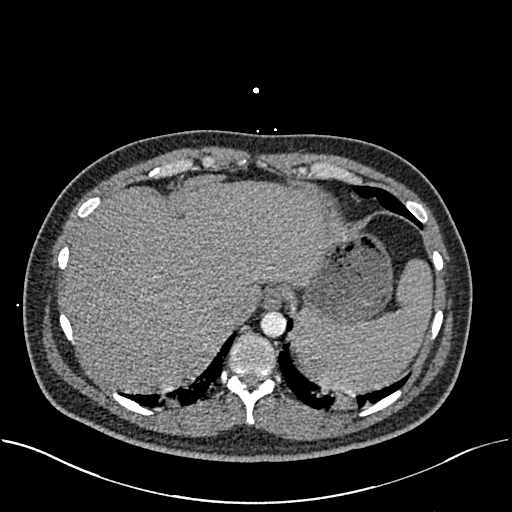
[im 100/318  lung]
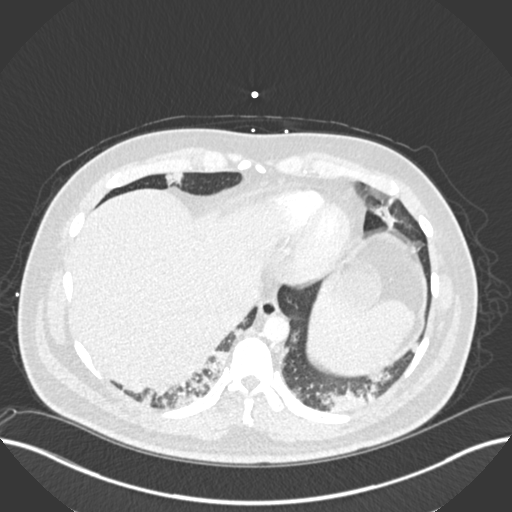
[im 119/318  soft-tissue]
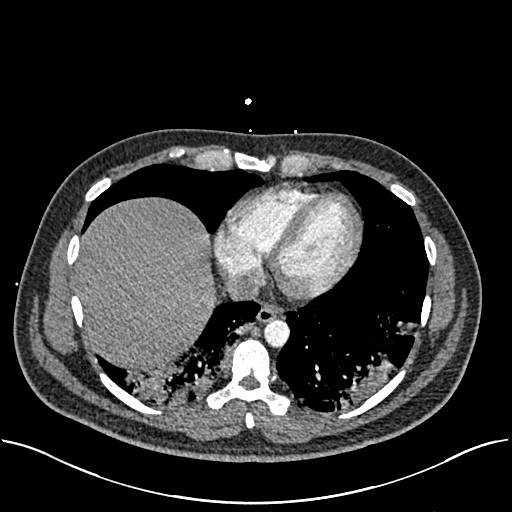
[im 139/318  lung]
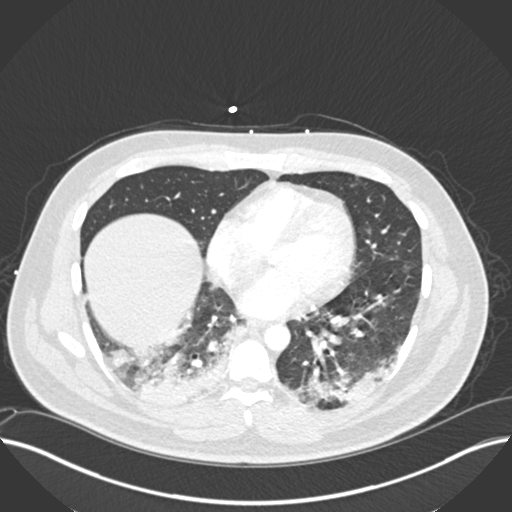
[im 159/318  soft-tissue]
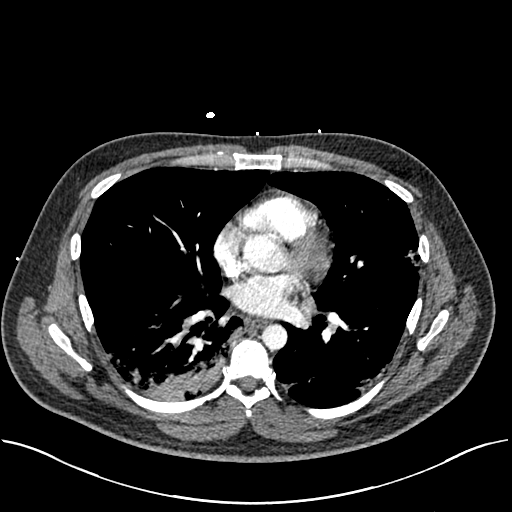
[im 179/318  lung]
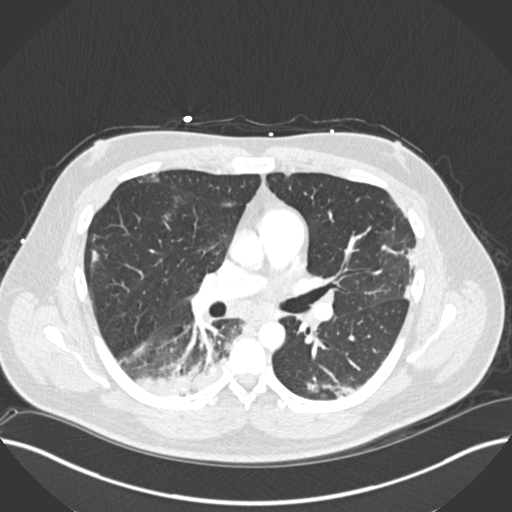
[im 199/318  soft-tissue]
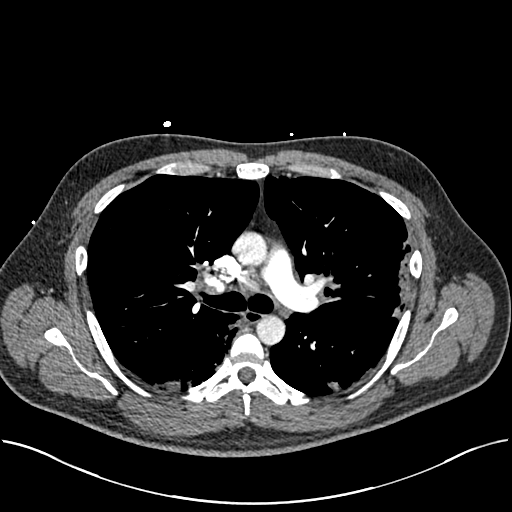
[im 218/318  lung]
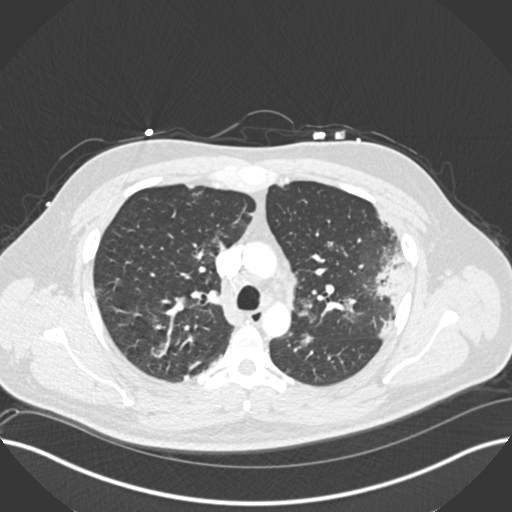
[im 238/318  soft-tissue]
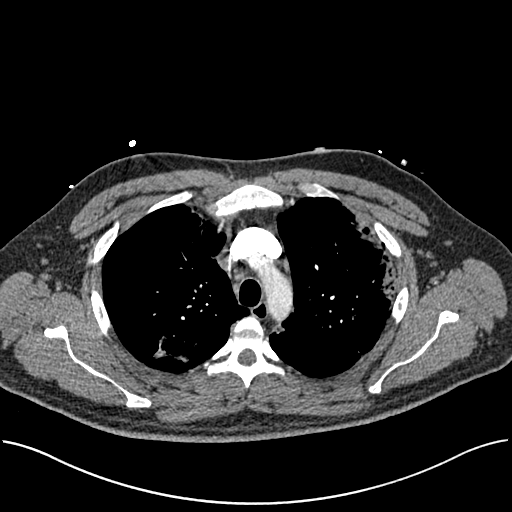
[im 258/318  lung]
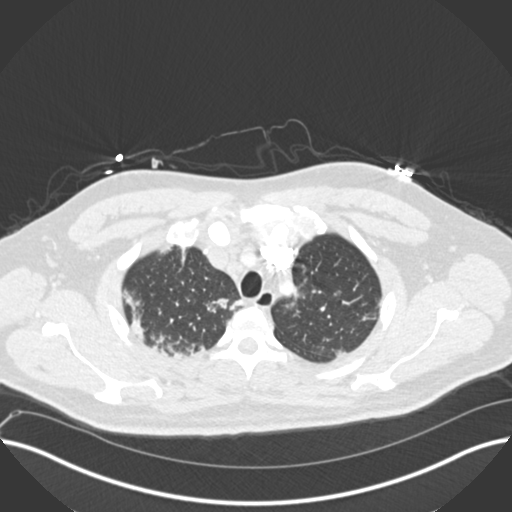
[im 278/318  soft-tissue]
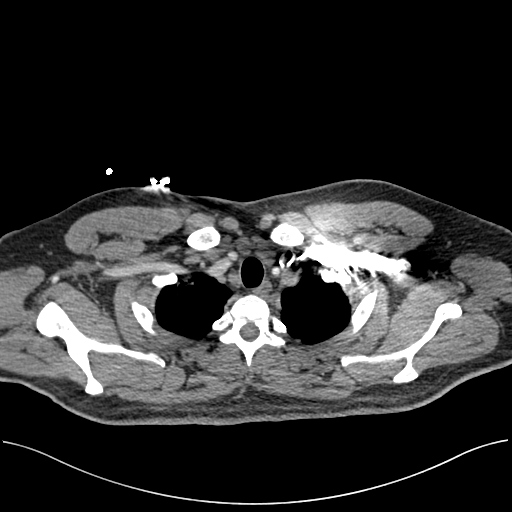
[im 298/318  lung]
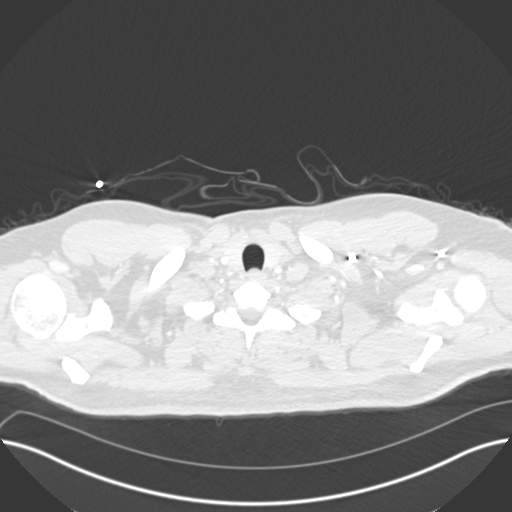

[Series 7: coronal mpr · coronal · 0.62mm/px · 2 of 92 slices shown]
[im 31/92  soft-tissue]
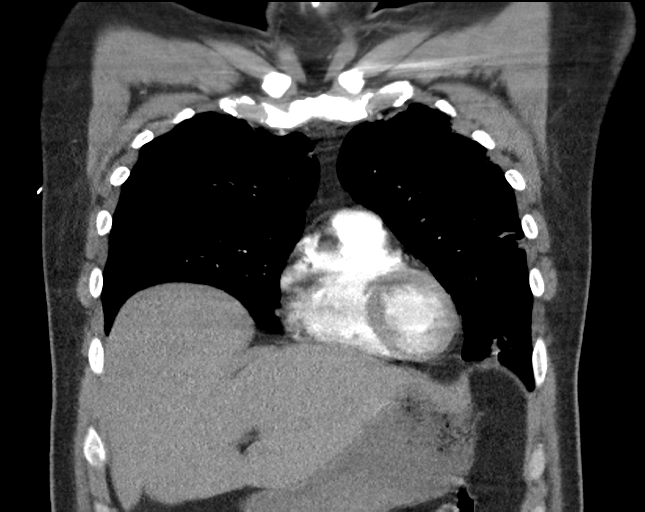
[im 61/92  soft-tissue]
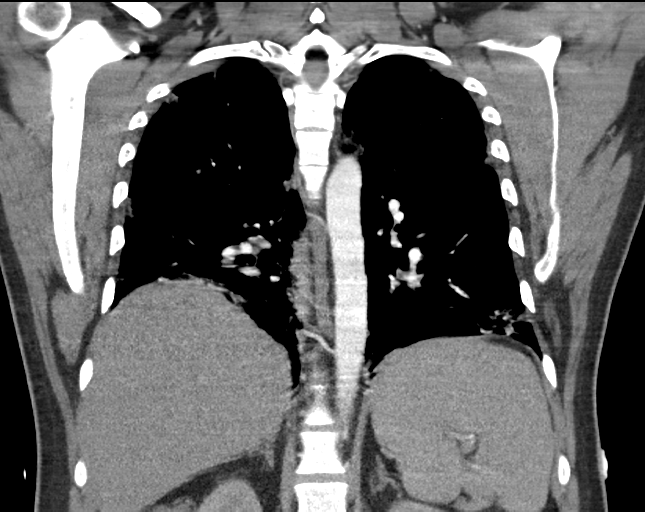

[17 of 46 positions shown; findings below may reference images not displayed]

FINDINGS: Cardiovascular: Satisfactory opacification of the pulmonary arteries
to the segmental level. No evidence of pulmonary embolism. Normal
heart size. No pericardial effusion.

Mediastinum/Nodes: Mediastinal and hilar adenopathy is decreased
from prior exam and likely reactive. For reference, a subcarinal
lymph node measures 14 mm in short axis compared to 22 mm on prior
exam. The esophagus, thyroid, and trachea appear normal.

Lungs/Pleura: Moderate bilateral peripheral predominant
consolidation and ground-glass opacities have decreased since
07/28/2020, consistent with 4KO7L-HV pneumonia. There is no pleural
effusion or pneumothorax.

Upper Abdomen: No acute abnormality.

Musculoskeletal: No chest wall abnormality. No acute or significant
osseous findings.

Review of the MIP images confirms the above findings.
IMPRESSION: 1. No evidence of pulmonary embolism.
2. Moderate bilateral peripheral predominant consolidation and
ground-glass opacities have decreased since 07/28/2020, consistent
with [id number]-HV pneumonia.

## 2022-08-22 ENCOUNTER — Emergency Department (HOSPITAL_COMMUNITY)
Admission: EM | Admit: 2022-08-22 | Discharge: 2022-08-22 | Disposition: A | Discharge status: Home / Self Care | Payer: Medicaid Other | Attending: Emergency Medicine | Admitting: Emergency Medicine

## 2022-08-22 DIAGNOSIS — J069 Acute upper respiratory infection, unspecified: Secondary | ICD-10-CM | POA: Insufficient documentation

## 2022-08-22 DIAGNOSIS — R059 Cough, unspecified: Secondary | ICD-10-CM | POA: Diagnosis present

## 2022-08-22 MED ORDER — AZITHROMYCIN 250 MG PO TABS: 250.0000 mg | ORAL_TABLET | Freq: Every day | ORAL | 0 refills | Status: AC

## 2022-08-22 NOTE — Discharge Instructions (Addendum)
Please follow-up with a family doctor.  If your symptoms persist and they may need to send you to an allergist or pulmonologist.

## 2022-08-22 NOTE — ED Triage Notes (Addendum)
Patient arrived stating he has had sinus problems since December and his cough has worsened where he starts to feel dizzy and gets a headache when he has a coughing spell.

## 2022-08-22 NOTE — ED Provider Notes (Signed)
[place] [facility] Provider Note   CSN: [id number] Arrival date & time: 08/22/22  0422     History  Chief Complaint  Patient presents with   Cough    James Vaughan is a 37 y.o. male.  37 yo M with a chief complaints of cough.  He tells me has been going on for couple months now.  He sometimes feels short of breath when he gets up and walks around and feels like he must have a sinus infection but has been coughing up some greenish mucus.  He works in a [employer] and he thinks maybe that is part of his issues.   Cough      Home Medications Prior to Admission medications   Medication Sig Start Date End Date Taking? Authorizing Provider  azithromycin (ZITHROMAX) 250 MG tablet Take 1 tablet (250 mg total) by mouth daily. Take first 2 tablets together, then 1 every day until finished. 08/22/22  Yes Deno Etienne, DO  albuterol (VENTOLIN HFA) 108 (90 Base) MCG/ACT inhaler Inhale 2 puffs into the lungs every 2 (two) hours as needed for wheezing or shortness of breath. 07/30/20 08/29/20  Elodia Florence., MD  clotrimazole (LOTRIMIN) 1 % cream Apply to affected area 2 times daily 04/28/21   Valarie Merino, MD  doxycycline (VIBRAMYCIN) 100 MG capsule Take 1 capsule (100 mg total) by mouth 2 (two) times daily. 04/28/21   Valarie Merino, MD      Allergies    Patient has no known allergies.    Review of Systems   Review of Systems  Respiratory:  Positive for cough.     Physical Exam Updated Vital Signs There were no vitals taken for this visit. Physical Exam Vitals and nursing note reviewed.  Constitutional:      Appearance: He is well-developed.  HENT:     Head: Normocephalic and atraumatic.     Comments: Swollen turbinates, posterior nasal drip, no noted sinus ttp, tm normal bilaterally.   Eyes:     Pupils: Pupils are equal, round, and reactive to light.  Neck:     Vascular: No JVD.  Cardiovascular:     Rate and Rhythm: Normal  rate and regular rhythm.     Heart sounds: No murmur heard.    No friction rub. No gallop.  Pulmonary:     Effort: No respiratory distress.     Breath sounds: No wheezing.  Abdominal:     General: There is no distension.     Tenderness: There is no abdominal tenderness. There is no guarding or rebound.  Musculoskeletal:        General: Normal range of motion.     Cervical back: Normal range of motion and neck supple.  Skin:    Coloration: Skin is not pale.     Findings: No rash.  Neurological:     Mental Status: He is alert and oriented to person, place, and time.  Psychiatric:        Behavior: Behavior normal.     ED Results / Procedures / Treatments   Labs (all labs ordered are listed, but only abnormal results are displayed) Labs Reviewed - No data to display  EKG None  Radiology No results found.  Procedures Procedures    Medications Ordered in ED Medications - No data to display  ED Course/ Medical Decision Making/ A&P  Medical Decision Making Risk Prescription drug management.   37 yo M with a chief complaints of persistent cough.  Going on for about 3 months now.  Will try a Z-Pak for possible pertussis.  Offered x-ray imaging which she is declining.  Clear lung sounds for me.  Well-appearing and nontoxic.  No hypoxia.  Will discharge home.  5:27 AM:  I have discussed the diagnosis/risks/treatment options with the patient.  Evaluation and diagnostic testing in the emergency department does not suggest an emergent condition requiring admission or immediate intervention beyond what has been performed at this time.  They will follow up with PCP. We also discussed returning to the ED immediately if new or worsening sx occur. We discussed the sx which are most concerning (e.g., sudden worsening pain, fever, inability to tolerate by mouth) that necessitate immediate return. Medications administered to the patient during their visit and  any new prescriptions provided to the patient are listed below.  Medications given during this visit Medications - No data to display   The patient appears reasonably screen and/or stabilized for discharge and I doubt any other medical condition or other [facility] requiring further screening, evaluation, or treatment in the ED at this time prior to discharge.          Final Clinical Impression(s) / ED Diagnoses Final diagnoses:  Viral URI with cough    Rx / DC Orders ED Discharge Orders          Ordered    azithromycin (ZITHROMAX) 250 MG tablet  Daily        08/22/22 [place], [place], DO 08/22/22 [id number]
